# Patient Record
Sex: Female | Born: 1970
Health system: Southern US, Community
[De-identification: ages and names within clinical notes are randomized; demographics above are authoritative.]

## PROBLEM LIST (undated history)

## (undated) DIAGNOSIS — F419 Anxiety disorder, unspecified: Secondary | ICD-10-CM

## (undated) DIAGNOSIS — D649 Anemia, unspecified: Secondary | ICD-10-CM

## (undated) DIAGNOSIS — K759 Inflammatory liver disease, unspecified: Secondary | ICD-10-CM

## (undated) DIAGNOSIS — R7303 Prediabetes: Secondary | ICD-10-CM

## (undated) DIAGNOSIS — G4730 Sleep apnea, unspecified: Secondary | ICD-10-CM

## (undated) HISTORY — PX: WISDOM TOOTH EXTRACTION: SHX21

## (undated) HISTORY — DX: Sleep apnea, unspecified: G47.30

## (undated) HISTORY — DX: Anxiety disorder, unspecified: F41.9

## (undated) HISTORY — DX: Anemia, unspecified: D64.9

---

## 2005-08-19 ENCOUNTER — Emergency Department: Payer: Self-pay | Admitting: Emergency Medicine

## 2005-12-11 ENCOUNTER — Ambulatory Visit: Payer: Self-pay | Admitting: Family Medicine

## 2006-12-25 DIAGNOSIS — D509 Iron deficiency anemia, unspecified: Secondary | ICD-10-CM | POA: Insufficient documentation

## 2008-03-22 ENCOUNTER — Other Ambulatory Visit: Payer: Self-pay

## 2008-03-22 ENCOUNTER — Emergency Department: Payer: Self-pay | Admitting: Emergency Medicine

## 2009-03-13 DIAGNOSIS — J309 Allergic rhinitis, unspecified: Secondary | ICD-10-CM | POA: Insufficient documentation

## 2010-05-17 ENCOUNTER — Emergency Department: Payer: Self-pay | Admitting: Emergency Medicine

## 2010-05-27 DIAGNOSIS — K759 Inflammatory liver disease, unspecified: Secondary | ICD-10-CM | POA: Insufficient documentation

## 2014-01-20 ENCOUNTER — Ambulatory Visit: Payer: Self-pay | Admitting: Family Medicine

## 2014-01-25 DIAGNOSIS — M25569 Pain in unspecified knee: Secondary | ICD-10-CM | POA: Insufficient documentation

## 2014-01-26 ENCOUNTER — Other Ambulatory Visit: Payer: Self-pay | Admitting: Orthopedic Surgery

## 2014-01-26 DIAGNOSIS — M25561 Pain in right knee: Secondary | ICD-10-CM

## 2014-05-16 ENCOUNTER — Ambulatory Visit: Payer: Self-pay | Admitting: Family Medicine

## 2014-06-01 ENCOUNTER — Ambulatory Visit
Admission: RE | Admit: 2014-06-01 | Discharge: 2014-06-01 | Disposition: A | Discharge status: Home / Self Care | Payer: 59 | Source: Ambulatory Visit | Attending: Orthopedic Surgery | Admitting: Orthopedic Surgery

## 2014-06-01 DIAGNOSIS — M25561 Pain in right knee: Secondary | ICD-10-CM

## 2014-06-06 ENCOUNTER — Other Ambulatory Visit: Payer: Self-pay | Admitting: Orthopedic Surgery

## 2014-06-06 DIAGNOSIS — M25561 Pain in right knee: Secondary | ICD-10-CM

## 2014-06-09 ENCOUNTER — Ambulatory Visit
Admission: RE | Admit: 2014-06-09 | Discharge: 2014-06-09 | Disposition: A | Discharge status: Home / Self Care | Payer: 59 | Source: Ambulatory Visit | Attending: Orthopedic Surgery | Admitting: Orthopedic Surgery

## 2014-06-09 ENCOUNTER — Other Ambulatory Visit: Payer: 59

## 2014-06-09 DIAGNOSIS — M25561 Pain in right knee: Secondary | ICD-10-CM

## 2014-06-28 ENCOUNTER — Other Ambulatory Visit: Payer: Self-pay | Admitting: Orthopedic Surgery

## 2014-07-25 ENCOUNTER — Ambulatory Visit: Admit: 2014-07-25 | Payer: Self-pay | Admitting: Orthopedic Surgery

## 2014-07-25 SURGERY — ARTHROSCOPY, KNEE
Anesthesia: Regional | Laterality: Right

## 2014-08-10 ENCOUNTER — Encounter (HOSPITAL_COMMUNITY): Payer: Self-pay | Admitting: Pharmacy Technician

## 2014-08-11 ENCOUNTER — Inpatient Hospital Stay (HOSPITAL_COMMUNITY): Admission: RE | Admit: 2014-08-11 | Payer: Self-pay | Source: Ambulatory Visit

## 2014-08-12 NOTE — Pre-Procedure Instructions (Addendum)
Susan Sutton  08/12/2014   Your procedure is scheduled on:  08/22/14  [facility] at 1030 AM.  Call this number if you have problems the morning of surgery: [phone]   Remember:   Do not eat food or drink liquids after midnight.   Take these medicines the morning of surgery with A SIP OF WATER: none   STOP all herbel meds, nsaids (aleve,naproxen,advil,ibuprofen) 5 days prior to surgery starting 08/17/14 including vitamins, aspirin   Do not wear jewelry, make-up or nail polish.  Do not wear lotions, powders, or perfumes. You may wear deodorant.  Do not shave 48 hours prior to surgery. Men may shave face and neck.  Do not bring valuables to the hospital.  [facility] is not responsible                  for any belongings or valuables.               Contacts, dentures or bridgework may not be worn into surgery.  Leave suitcase in the car. After surgery it may be brought to your room.  For patients admitted to the hospital, discharge time is determined by your                treatment team.               Patients discharged the day of surgery will not be allowed to drive  home.  Name and phone number of your driver:   Special Instructions:  Special Instructions: [place] - Preparing for Surgery  Before surgery, you can play an important role.  Because skin is not sterile, your skin needs to be as free of germs as possible.  You can reduce the number of germs on you skin by washing with CHG (chlorahexidine gluconate) soap before surgery.  CHG is an antiseptic cleaner which kills germs and bonds with the skin to continue killing germs even after washing.  Please DO NOT use if you have an allergy to CHG or antibacterial soaps.  If your skin becomes reddened/irritated stop using the CHG and inform your nurse when you arrive at Short Stay.  Do not shave (including legs and underarms) for at least 48 hours prior to the first CHG shower.  You may shave your  face.  Please follow these instructions carefully:   1.  Shower with CHG Soap the night before surgery and the morning of Surgery.  2.  If you choose to wash your hair, wash your hair first as usual with your normal shampoo.  3.  After you shampoo, rinse your hair and body thoroughly to remove the Shampoo.  4.  Use CHG as you would any other liquid soap.  You can apply chg directly  to the skin and wash gently with scrungie or a clean washcloth.  5.  Apply the CHG Soap to your body ONLY FROM THE NECK DOWN.  Do not use on open wounds or open sores.  Avoid contact with your eyes ears, mouth and genitals (private parts).  Wash genitals (private parts)       with your normal soap.  6.  Wash thoroughly, paying special attention to the area where your surgery will be performed.  7.  Thoroughly rinse your body with warm water from the neck down.  8.  DO NOT shower/wash with your normal soap after using and rinsing off the CHG Soap.  9.  Pat yourself dry  with a clean towel.            10.  Wear clean pajamas.            11.  Place clean sheets on your bed the night of your first shower and do not sleep with pets.  Day of Surgery  Do not apply any lotions/deodorants the morning of surgery.  Please wear clean clothes to the hospital/surgery center.   Please read over the following fact sheets that you were given: Pain Booklet, Coughing and Deep Breathing and Surgical Site Infection Prevention

## 2014-08-15 ENCOUNTER — Encounter (HOSPITAL_COMMUNITY): Payer: Self-pay

## 2014-08-15 ENCOUNTER — Encounter (HOSPITAL_COMMUNITY)
Admission: RE | Admit: 2014-08-15 | Discharge: 2014-08-15 | Disposition: A | Discharge status: Home / Self Care | Payer: 59 | Source: Ambulatory Visit | Attending: Orthopedic Surgery | Admitting: Orthopedic Surgery

## 2014-08-15 DIAGNOSIS — S83281A Other tear of lateral meniscus, current injury, right knee, initial encounter: Secondary | ICD-10-CM | POA: Insufficient documentation

## 2014-08-15 DIAGNOSIS — X58XXXA Exposure to other specified factors, initial encounter: Secondary | ICD-10-CM | POA: Diagnosis not present

## 2014-08-15 DIAGNOSIS — Z0000 Encounter for general adult medical examination without abnormal findings: Secondary | ICD-10-CM | POA: Diagnosis present

## 2014-08-15 DIAGNOSIS — S83241A Other tear of medial meniscus, current injury, right knee, initial encounter: Secondary | ICD-10-CM | POA: Insufficient documentation

## 2014-08-15 DIAGNOSIS — Y9389 Activity, other specified: Secondary | ICD-10-CM | POA: Diagnosis not present

## 2014-08-15 DIAGNOSIS — Y998 Other external cause status: Secondary | ICD-10-CM | POA: Diagnosis not present

## 2014-08-15 DIAGNOSIS — B1910 Unspecified viral hepatitis B without hepatic coma: Secondary | ICD-10-CM | POA: Diagnosis not present

## 2014-08-15 DIAGNOSIS — Y9289 Other specified places as the place of occurrence of the external cause: Secondary | ICD-10-CM | POA: Insufficient documentation

## 2014-08-15 HISTORY — DX: Inflammatory liver disease, unspecified: K75.9

## 2014-08-15 LAB — COMPREHENSIVE METABOLIC PANEL
ALT: 10 U/L (ref 0–35)
ANION GAP: 13 (ref 5–15)
AST: 13 U/L (ref 0–37)
Albumin: 3.4 g/dL — ABNORMAL LOW (ref 3.5–5.2)
Alkaline Phosphatase: 108 U/L (ref 39–117)
BUN: 10 mg/dL (ref 6–23)
CO2: 21 mEq/L (ref 19–32)
Calcium: 9.1 mg/dL (ref 8.4–10.5)
Chloride: 104 mEq/L (ref 96–112)
Creatinine, Ser: 0.87 mg/dL (ref 0.50–1.10)
GFR calc non Af Amer: 81 mL/min — ABNORMAL LOW (ref >90)
GLUCOSE: 104 mg/dL — AB (ref 70–99)
POTASSIUM: 4.6 meq/L (ref 3.7–5.3)
Sodium: 138 mEq/L (ref 137–147)
Total Bilirubin: 0.3 mg/dL (ref 0.3–1.2)
Total Protein: 7.6 g/dL (ref 6.0–8.3)

## 2014-08-15 LAB — CBC
HEMATOCRIT: 34.9 % — AB (ref 36.0–46.0)
HEMOGLOBIN: 11.7 g/dL — AB (ref 12.0–15.0)
MCH: 26.7 pg (ref 26.0–34.0)
MCHC: 33.5 g/dL (ref 30.0–36.0)
MCV: 79.7 fL (ref 78.0–100.0)
Platelets: 279 10*3/uL (ref 150–400)
RBC: 4.38 MIL/uL (ref 3.87–5.11)
RDW: 13.6 % (ref 11.5–15.5)
WBC: 4.1 10*3/uL (ref 4.0–10.5)

## 2014-08-15 LAB — HCG, SERUM, QUALITATIVE: Preg, Serum: NEGATIVE

## 2014-08-21 MED ORDER — CEFAZOLIN SODIUM-DEXTROSE 2-3 GM-% IV SOLR: 2.0000 g | INTRAVENOUS | Status: AC

## 2014-08-22 ENCOUNTER — Encounter (HOSPITAL_COMMUNITY)
Admission: RE | Disposition: A | Discharge status: Home / Self Care | Payer: Self-pay | Source: Ambulatory Visit | Attending: Orthopedic Surgery

## 2014-08-22 ENCOUNTER — Ambulatory Visit (HOSPITAL_COMMUNITY): Payer: 59 | Admitting: Certified Registered Nurse Anesthetist

## 2014-08-22 ENCOUNTER — Encounter (HOSPITAL_COMMUNITY): Payer: 59 | Admitting: Certified Registered Nurse Anesthetist

## 2014-08-22 ENCOUNTER — Encounter (HOSPITAL_COMMUNITY): Payer: Self-pay | Admitting: Certified Registered Nurse Anesthetist

## 2014-08-22 ENCOUNTER — Ambulatory Visit (HOSPITAL_COMMUNITY)
Admission: RE | Admit: 2014-08-22 | Discharge: 2014-08-22 | Disposition: A | Discharge status: Home / Self Care | Payer: 59 | Source: Ambulatory Visit | Attending: Orthopedic Surgery | Admitting: Orthopedic Surgery

## 2014-08-22 DIAGNOSIS — S83241A Other tear of medial meniscus, current injury, right knee, initial encounter: Secondary | ICD-10-CM | POA: Diagnosis present

## 2014-08-22 DIAGNOSIS — X58XXXA Exposure to other specified factors, initial encounter: Secondary | ICD-10-CM | POA: Insufficient documentation

## 2014-08-22 DIAGNOSIS — Z9889 Other specified postprocedural states: Secondary | ICD-10-CM

## 2014-08-22 HISTORY — PX: KNEE ARTHROSCOPY: SHX127

## 2014-08-22 SURGERY — ARTHROSCOPY, KNEE
Anesthesia: General | Site: Knee | Laterality: Right

## 2014-08-22 MED ORDER — LACTATED RINGERS IV SOLN
INTRAVENOUS | Status: DC
  Administered 2014-08-22 (×2): via INTRAVENOUS

## 2014-08-22 MED ORDER — OXYCODONE HCL 5 MG PO TABS
5.0000 mg | ORAL_TABLET | Freq: Once | ORAL | Status: AC | PRN
  Administered 2014-08-22: 5 mg via ORAL

## 2014-08-22 MED ORDER — BUPIVACAINE-EPINEPHRINE 0.5% -1:200000 IJ SOLN
INTRAMUSCULAR | Status: DC | PRN
  Administered 2014-08-22: 20 mL

## 2014-08-22 MED ORDER — FENTANYL CITRATE 0.05 MG/ML IJ SOLN
INTRAMUSCULAR | Status: DC | PRN
  Administered 2014-08-22: 50 ug via INTRAVENOUS
  Administered 2014-08-22 (×3): 25 ug via INTRAVENOUS
  Administered 2014-08-22: 50 ug via INTRAVENOUS
  Administered 2014-08-22: 100 ug via INTRAVENOUS
  Administered 2014-08-22 (×3): 25 ug via INTRAVENOUS

## 2014-08-22 MED ORDER — ONDANSETRON HCL 4 MG/2ML IJ SOLN
INTRAMUSCULAR | Status: AC
  Filled 2014-08-22: qty 2

## 2014-08-22 MED ORDER — OXYCODONE HCL 5 MG PO TABS
ORAL_TABLET | ORAL | Status: AC
  Filled 2014-08-22: qty 1

## 2014-08-22 MED ORDER — CHLORHEXIDINE GLUCONATE 4 % EX LIQD
60.0000 mL | Freq: Once | CUTANEOUS | Status: DC
  Filled 2014-08-22: qty 60

## 2014-08-22 MED ORDER — DEXAMETHASONE SODIUM PHOSPHATE 4 MG/ML IJ SOLN
INTRAMUSCULAR | Status: DC | PRN
  Administered 2014-08-22: 4 mg via INTRAVENOUS

## 2014-08-22 MED ORDER — MIDAZOLAM HCL 2 MG/2ML IJ SOLN
INTRAMUSCULAR | Status: AC
  Filled 2014-08-22: qty 2

## 2014-08-22 MED ORDER — DIPHENHYDRAMINE HCL 50 MG/ML IJ SOLN
INTRAMUSCULAR | Status: AC
  Filled 2014-08-22: qty 1

## 2014-08-22 MED ORDER — TRAMADOL HCL 50 MG PO TABS: 50.0000 mg | ORAL_TABLET | Freq: Four times a day (QID) | ORAL | Status: DC | PRN

## 2014-08-22 MED ORDER — CYCLOBENZAPRINE HCL 5 MG PO TABS: 5.0000 mg | ORAL_TABLET | Freq: Three times a day (TID) | ORAL | Status: DC | PRN

## 2014-08-22 MED ORDER — CEFAZOLIN SODIUM-DEXTROSE 2-3 GM-% IV SOLR: 2.0000 g | INTRAVENOUS | Status: DC

## 2014-08-22 MED ORDER — DIPHENHYDRAMINE HCL 50 MG/ML IJ SOLN
INTRAMUSCULAR | Status: DC | PRN
  Administered 2014-08-22: 25 mg via INTRAVENOUS

## 2014-08-22 MED ORDER — DEXAMETHASONE SODIUM PHOSPHATE 4 MG/ML IJ SOLN
INTRAMUSCULAR | Status: AC
  Filled 2014-08-22: qty 1

## 2014-08-22 MED ORDER — PROPOFOL 10 MG/ML IV BOLUS
INTRAVENOUS | Status: AC
  Filled 2014-08-22: qty 20

## 2014-08-22 MED ORDER — HYDROMORPHONE HCL 1 MG/ML IJ SOLN: 0.2500 mg | INTRAMUSCULAR | Status: DC | PRN

## 2014-08-22 MED ORDER — MIDAZOLAM HCL 5 MG/5ML IJ SOLN
INTRAMUSCULAR | Status: DC | PRN
  Administered 2014-08-22: 2 mg via INTRAVENOUS

## 2014-08-22 MED ORDER — ONDANSETRON HCL 4 MG/2ML IJ SOLN
INTRAMUSCULAR | Status: DC | PRN
  Administered 2014-08-22: 4 mg via INTRAVENOUS

## 2014-08-22 MED ORDER — PROPOFOL 10 MG/ML IV BOLUS
INTRAVENOUS | Status: DC | PRN
  Administered 2014-08-22: 200 mg via INTRAVENOUS

## 2014-08-22 MED ORDER — BUPIVACAINE-EPINEPHRINE (PF) 0.5% -1:200000 IJ SOLN
INTRAMUSCULAR | Status: AC
  Filled 2014-08-22: qty 30

## 2014-08-22 MED ORDER — FENTANYL CITRATE 0.05 MG/ML IJ SOLN
INTRAMUSCULAR | Status: AC
  Filled 2014-08-22: qty 5

## 2014-08-22 MED ORDER — OXYCODONE HCL 5 MG/5ML PO SOLN: 5.0000 mg | Freq: Once | ORAL | Status: AC | PRN

## 2014-08-22 MED ORDER — CEFAZOLIN SODIUM-DEXTROSE 2-3 GM-% IV SOLR
INTRAVENOUS | Status: DC
Start: 2014-08-22 — End: 2014-08-22
  Filled 2014-08-22: qty 50

## 2014-08-22 MED ORDER — CHLORHEXIDINE GLUCONATE 4 % EX LIQD
60.0000 mL | Freq: Once | CUTANEOUS | Status: DC
Start: 2014-08-22 — End: 2014-08-22
  Filled 2014-08-22: qty 60

## 2014-08-22 MED ORDER — DEXTROSE 5 % IV SOLN
3.0000 g | Freq: Once | INTRAVENOUS | Status: AC
  Administered 2014-08-22: 3 g via INTRAVENOUS
  Filled 2014-08-22: qty 3000

## 2014-08-22 MED ORDER — SODIUM CHLORIDE 0.9 % IR SOLN
Status: DC | PRN
  Administered 2014-08-22: 6000 mL

## 2014-08-22 MED ORDER — PROMETHAZINE HCL 25 MG/ML IJ SOLN: 6.2500 mg | INTRAMUSCULAR | Status: DC | PRN

## 2014-08-22 MED ORDER — IBUPROFEN 200 MG PO TABS: 800.0000 mg | ORAL_TABLET | Freq: Three times a day (TID) | ORAL | Status: DC

## 2014-08-22 SURGICAL SUPPLY — 31 items
BANDAGE ELASTIC 6 VELCRO ST LF (GAUZE/BANDAGES/DRESSINGS) ×2 IMPLANT
BLADE GREAT WHITE 4.2 (BLADE) ×2 IMPLANT
DRAPE ARTHROSCOPY W/POUCH 114 (DRAPES) ×2 IMPLANT
DRAPE U-SHAPE 47X51 STRL (DRAPES) ×2 IMPLANT
DRSG PAD ABDOMINAL 8X10 ST (GAUZE/BANDAGES/DRESSINGS) ×2 IMPLANT
ELECT REM PT RETURN 9FT ADLT (ELECTROSURGICAL)
ELECTRODE REM PT RTRN 9FT ADLT (ELECTROSURGICAL) IMPLANT
GAUZE SPONGE 4X4 12PLY STRL (GAUZE/BANDAGES/DRESSINGS) ×2 IMPLANT
GAUZE XEROFORM 1X8 LF (GAUZE/BANDAGES/DRESSINGS) ×2 IMPLANT
GLOVE BIOGEL PI IND STRL 8.5 (GLOVE) ×2 IMPLANT
GLOVE BIOGEL PI INDICATOR 8.5 (GLOVE) ×2
GLOVE SURG ORTHO 8.0 STRL STRW (GLOVE) ×4 IMPLANT
GOWN STRL REUS W/ TWL LRG LVL3 (GOWN DISPOSABLE) ×1 IMPLANT
GOWN STRL REUS W/TWL LRG LVL3 (GOWN DISPOSABLE) ×1
GOWN STRL REUS W/TWL XL LVL3 (GOWN DISPOSABLE) ×4 IMPLANT
KIT ROOM TURNOVER OR (KITS) ×2 IMPLANT
MANIFOLD NEPTUNE II (INSTRUMENTS) ×2 IMPLANT
PACK ARTHROSCOPY DSU (CUSTOM PROCEDURE TRAY) ×2 IMPLANT
PAD ARMBOARD 7.5X6 YLW CONV (MISCELLANEOUS) ×4 IMPLANT
PAD CAST 4YDX4 CTTN HI CHSV (CAST SUPPLIES) ×1 IMPLANT
PADDING CAST COTTON 4X4 STRL (CAST SUPPLIES) ×1
PENCIL BUTTON HOLSTER BLD 10FT (ELECTRODE) IMPLANT
SET ARTHROSCOPY TUBING (MISCELLANEOUS) ×1
SET ARTHROSCOPY TUBING LN (MISCELLANEOUS) ×1 IMPLANT
SPONGE LAP 4X18 X RAY DECT (DISPOSABLE) ×2 IMPLANT
SUT ETHILON 4 0 PS 2 18 (SUTURE) ×2 IMPLANT
SYR 30ML LL (SYRINGE) ×4 IMPLANT
TOWEL OR 17X24 6PK STRL BLUE (TOWEL DISPOSABLE) ×2 IMPLANT
TOWEL OR 17X26 10 PK STRL BLUE (TOWEL DISPOSABLE) ×2 IMPLANT
WAND HAND CNTRL MULTIVAC 90 (MISCELLANEOUS) ×2 IMPLANT
WATER STERILE IRR 1000ML POUR (IV SOLUTION) ×2 IMPLANT

## 2014-08-22 NOTE — Anesthesia Postprocedure Evaluation (Signed)
Anesthesia Post Note  Patient: Susan Sutton  Procedure(s) Performed: Procedure(s) (LRB): ARTHROSCOPY KNEE (Right)  Anesthesia type: general  Patient location: PACU  Post pain: Pain level controlled  Post assessment: Patient's Cardiovascular Status Stable  Last Vitals:  Filed Vitals:   08/22/14 1500  BP: 158/78  Pulse: 67  Temp:   Resp: 15    Post vital signs: Reviewed and stable  Level of consciousness: sedated  Complications: No apparent anesthesia complications

## 2014-08-22 NOTE — H&P (Signed)
  Susan Sutton MRN:  [id number] DOB/SEX:  04/30/1971/female  CHIEF COMPLAINT:  Painful right Knee  HISTORY: Patient is a 43 y.o. female presented with a history of pain in the right knee. Onset of symptoms was abrupt starting several weeks ago with rapidly worsening course since that time. Prior procedures on the knee include none. Patient has been treated conservatively with over-the-counter NSAIDs and activity modification. Patient currently rates pain in the knee at 8 out of 10 with activity. There is no pain at night.  PAST MEDICAL HISTORY: There are no active problems to display for this patient.  Past Medical History  Diagnosis Date  . Hepatitis     "b"   No past surgical history on file.   MEDICATIONS:   No prescriptions prior to admission    ALLERGIES:  No Known Allergies  REVIEW OF SYSTEMS:  A comprehensive review of systems was negative.   FAMILY HISTORY:  No family history on file.  SOCIAL HISTORY:   History  Substance Use Topics  . Smoking status: Never Smoker   . Smokeless tobacco: Not on file  . Alcohol Use: Yes     Comment: occ wine     EXAMINATION:  Vital signs in last 24 hours:    General appearance: alert, cooperative and no distress Lungs: clear to auscultation bilaterally Heart: regular rate and rhythm, S1, S2 normal, no murmur, click, rub or gallop Abdomen: soft, non-tender; bowel sounds normal; no masses,  no organomegaly Extremities: extremities normal, atraumatic, no cyanosis or edema and Homans sign is negative, no sign of DVT Pulses: 2+ and symmetric Skin: Skin color, texture, turgor normal. No rashes or lesions Neurologic: Alert and oriented X 3, normal strength and tone. Normal symmetric reflexes. Normal coordination and gait  Musculoskeletal:  ROM 0-115, Ligaments intact,  Imaging Review Plain radiographs demonstrate mild degenerative joint disease of the right knee.  MRI: meniscal tear  Assessment/Plan:  right knee meniscal  tear  Right knee arthroscopy  Shaqueena Mauceri 08/22/2014, 6:34 AM

## 2014-08-22 NOTE — Discharge Instructions (Signed)
Diet: As you were doing prior to hospitalization   Activity:  Increase activity slowly as tolerated                  No lifting or driving for 6 weeks  Shower:  May shower without a dressing once there is no drainage from your wound.                 Do NOT wash over the wound.                 Dressing:  You may change your dressing on Wednesday                    Then change the dressing daily with sterile 4"x4"s gauze dressing                     And TED hose for knees.  Weight Bearing:  Weight bearing as tolerated as taught in physical therapy.  Use a                                walker or Crutches as instructed.  To prevent constipation: you may use a stool softener such as -               Colace ( over the counter) 100 mg by mouth twice a day                Drink plenty of fluids ( prune juice may be helpful) and high fiber foods                Miralax ( over the counter) for constipation as needed.    Precautions:  If you experience chest pain or shortness of breath - call 911 immediately               For transfer to the hospital emergency department!!               If you develop a fever greater that 101 F, purulent drainage from wound,                             increased redness or drainage from wound, or calf pain -- Call the office.  Follow- Up Appointment:  Please call for an appointment to be seen on 08/25/14                                              [facility] office:  [phone]            [address] [place], [place] [address]

## 2014-08-22 NOTE — Transfer of Care (Signed)
Immediate Anesthesia Transfer of Care Note  Patient: Susan Sutton  Procedure(s) Performed: Procedure(s) with comments: ARTHROSCOPY KNEE (Right) - Partial medial menisectomy  Patient Location: PACU  Anesthesia Type:General  Level of Consciousness: awake, alert  and oriented  Airway & Oxygen Therapy: Patient Spontanous Breathing and Patient connected to nasal cannula oxygen  Post-op Assessment: Report given to PACU RN and Post -op Vital signs reviewed and stable  Post vital signs: Reviewed and stable  Complications: No apparent anesthesia complications

## 2014-08-22 NOTE — Anesthesia Procedure Notes (Signed)
Procedure Name: LMA Insertion Date/Time: 08/22/2014 1:28 PM Performed by: Margaree MackintoshYACOUB, Tyreece Gelles B Pre-anesthesia Checklist: Patient identified, Emergency Drugs available, Suction available, Patient being monitored and Timeout performed Patient Re-evaluated:Patient Re-evaluated prior to inductionOxygen Delivery Method: Circle system utilized Preoxygenation: Pre-oxygenation with 100% oxygen Intubation Type: IV induction LMA: LMA inserted LMA Size: 4.0 Number of attempts: 1 Placement Confirmation: breath sounds checked- equal and bilateral and positive ETCO2 Tube secured with: Tape Dental Injury: Teeth and Oropharynx as per pre-operative assessment

## 2014-08-22 NOTE — Anesthesia Preprocedure Evaluation (Addendum)
Anesthesia Evaluation  Patient identified by MRN, date of birth, ID band Patient awake    Reviewed: Allergy & Precautions, H&P , NPO status , Patient's Chart, lab work & pertinent test results  Airway Mallampati: II TM Distance: >3 FB Neck ROM: full    Dental  (+) Teeth Intact, Dental Advidsory Given   Pulmonary neg pulmonary ROS,  breath sounds clear to auscultation        Cardiovascular negative cardio ROS  Rhythm:regular Rate:Normal     Neuro/Psych negative neurological ROS  negative psych ROS   GI/Hepatic negative GI ROS, (+) Hepatitis -, B  Endo/Other  negative endocrine ROS  Renal/GU negative Renal ROS     Musculoskeletal   Abdominal   Peds  Hematology   Anesthesia Other Findings   Reproductive/Obstetrics negative OB ROS                          Anesthesia Physical Anesthesia Plan  ASA: II  Anesthesia Plan: General LMA   Post-op Pain Management:    Induction:   Airway Management Planned:   Additional Equipment:   Intra-op Plan:   Post-operative Plan:   Informed Consent: I have reviewed the patients History and Physical, chart, labs and discussed the procedure including the risks, benefits and alternatives for the proposed anesthesia with the patient or authorized representative who has indicated his/her understanding and acceptance.   Dental Advisory Given  Plan Discussed with: Anesthesiologist, CRNA and Surgeon  Anesthesia Plan Comments:         Anesthesia Quick Evaluation

## 2014-08-23 ENCOUNTER — Encounter (HOSPITAL_COMMUNITY): Payer: Self-pay | Admitting: Orthopedic Surgery

## 2014-08-23 NOTE — Op Note (Signed)
NAMSallee Sutton:  Hines, Sriya                 ACCOUNT NO.:  [id number]  MEDICAL RECORD NO.:  [id number]  LOCATION:  [facility]                         FACILITY:  [facility]  PHYSICIAN:  Mila HomerStephen D. Sherlean FootLucey, M.D. DATE OF BIRTH:  1971/03/29  DATE OF PROCEDURE:  08/22/2014 DATE OF DISCHARGE:  08/22/2014                              OPERATIVE REPORT   SURGEON:  Mila HomerStephen D. Sherlean FootLucey, M.D.  ASSISTANT:  None.  ANESTHESIA:  General.  PREOPERATIVE DIAGNOSIS:  Right knee medial meniscus tear.  POSTOPERATIVE DIAGNOSIS:  Right knee medial meniscus tear.  PROCEDURE:  Right knee arthroscopy with partial medial meniscectomy.  INDICATION FOR PROCEDURE:  The patient is a 43 year old, white female with mechanical symptoms.  MRI evidence of the posterior horn medial meniscus tear.  Informed consent was obtained.  DESCRIPTION OF PROCEDURE:  The patient was laid supine, administered general anesthesia.  The right leg was prepped and draped in usual fashion.  Inferolateral and inferomedial portals were created with a #11 blade, blunt trocar, and cannula.  Diagnostic arthroscopy revealed no chondromalacia in the patellofemoral joint.  Went into the medial compartment, she had grade 2 chondromalacia which was debrided lightly with a [employer].  The posterior horn had complex tearing.  I used straight and up-biting basket forceps to perform partial medial meniscectomy.  I then went into the notch, the ACL and PCL were normal. Went into the figure 4 position, the lateral part was normal as well.  I then lavaged and closed with 4-0 nylon sutures.  Dressed with Xeroform dressing, sponges, sterile Webril, and Ace wrap.  COMPLICATIONS:  None.  DRAINS:  None.          ______________________________ Mila HomerStephen D. Sherlean FootLucey, M.D.     SDL/MEDQ  D:  08/23/2014  T:  08/23/2014  Job:  [id number]

## 2014-08-23 NOTE — Op Note (Signed)
Dictation Number:  [id number]

## 2015-01-23 LAB — LIPID PANEL
CHOLESTEROL: 146 mg/dL (ref 0–200)
HDL: 50 mg/dL (ref 35–70)
LDL Cholesterol: 84 mg/dL
TRIGLYCERIDES: 61 mg/dL (ref 40–160)

## 2015-01-23 LAB — HEPATIC FUNCTION PANEL
ALT: 19 U/L (ref 7–35)
AST: 15 U/L (ref 13–35)
Alkaline Phosphatase: 109 U/L (ref 25–125)
Bilirubin, Total: 0.5 mg/dL

## 2015-01-23 LAB — CBC AND DIFFERENTIAL
HEMATOCRIT: 36 % (ref 36–46)
Hemoglobin: 11.8 g/dL — AB (ref 12.0–16.0)
Neutrophils Absolute: 1 /uL
Platelets: 247 10*3/uL (ref 150–399)
WBC: 3.5 10^3/mL

## 2015-01-23 LAB — BASIC METABOLIC PANEL
BUN: 8 mg/dL (ref 4–21)
Creatinine: 0.8 mg/dL (ref 0.5–1.1)
Glucose: 96 mg/dL
Potassium: 4.3 mmol/L (ref 3.4–5.3)
SODIUM: 139 mmol/L (ref 137–147)

## 2015-01-23 LAB — TSH: TSH: 1.6 u[IU]/mL (ref 0.41–5.90)

## 2015-12-28 DIAGNOSIS — E669 Obesity, unspecified: Secondary | ICD-10-CM | POA: Insufficient documentation

## 2016-01-22 ENCOUNTER — Encounter: Payer: Self-pay | Admitting: Family Medicine

## 2016-02-05 ENCOUNTER — Encounter: Payer: Self-pay | Admitting: Family Medicine

## 2016-02-05 ENCOUNTER — Ambulatory Visit (INDEPENDENT_AMBULATORY_CARE_PROVIDER_SITE_OTHER): Payer: Commercial Managed Care - HMO | Admitting: Family Medicine

## 2016-02-05 VITALS — BP 132/80 | HR 72 | Temp 98.1°F | Resp 16 | Ht 67.0 in | Wt 309.0 lb

## 2016-02-05 DIAGNOSIS — Z1239 Encounter for other screening for malignant neoplasm of breast: Secondary | ICD-10-CM | POA: Diagnosis not present

## 2016-02-05 DIAGNOSIS — Z124 Encounter for screening for malignant neoplasm of cervix: Secondary | ICD-10-CM | POA: Diagnosis not present

## 2016-02-05 DIAGNOSIS — Z0000 Encounter for general adult medical examination without abnormal findings: Secondary | ICD-10-CM | POA: Diagnosis not present

## 2016-02-05 DIAGNOSIS — R002 Palpitations: Secondary | ICD-10-CM

## 2016-02-05 DIAGNOSIS — D509 Iron deficiency anemia, unspecified: Secondary | ICD-10-CM

## 2016-02-05 DIAGNOSIS — E669 Obesity, unspecified: Secondary | ICD-10-CM | POA: Diagnosis not present

## 2016-02-05 NOTE — Progress Notes (Signed)
Patient ID: Susan Sutton Sutton, female   DOB: 07/09/1971, 45 y.o.   MRN: [id number]       Patient: Susan Sutton Irigoyen, Female    DOB: 07/19/1971, 45 y.o.   MRN: [id number] Visit Date: 02/05/2016  Today's Provider: Lorie PhenixNancy Infant Doane, MD   Chief Complaint  Patient presents with  . Annual Exam   Subjective:    Annual physical exam Susan Sutton Mcwhirt is a 45 y.o. female who presents today for health maintenance and complete physical. She feels well. She reports exercising none. She reports she is sleeping well.  01/20/15 CPE 01/17/14 Pap-neg; HPV-neg 05/16/14 Mammogram-BI-RADS 1 -----------------------------------------------------------------   Review of Systems  Constitutional: Negative.   HENT: Negative.   Eyes: Negative.   Respiratory: Positive for shortness of breath.   Cardiovascular: Positive for palpitations.  Gastrointestinal: Positive for constipation.  Endocrine: Negative.   Genitourinary: Positive for urgency.  Musculoskeletal: Negative.   Skin: Negative.   Allergic/Immunologic: Negative.   Neurological: Negative.   Hematological: Negative.   Psychiatric/Behavioral: Negative.     Social History      She  reports that she has never smoked. She has never used smokeless tobacco. She reports that she does not drink alcohol or use illicit drugs.       Social History   Social History  . Marital Status: Single    Spouse Name: N/A  . Number of Children: N/A  . Years of Education: N/A   Social History Main Topics  . Smoking status: Never Smoker   . Smokeless tobacco: Never Used  . Alcohol Use: No     Comment: occ wine  . Drug Use: No  . Sexual Activity: Not Asked   Other Topics Concern  . None   Social History Narrative    Past Medical History  Diagnosis Date  . Hepatitis     "b"     Patient Active Problem List   Diagnosis Date Noted  . Adiposity 12/28/2015  . Gonalgia 01/25/2014  . Hepatitis 05/27/2010  . Allergic rhinitis 03/13/2009  . Anemia, iron deficiency  12/25/2006    Past Surgical History  Procedure Laterality Date  . Knee arthroscopy Right 08/22/2014    Procedure: ARTHROSCOPY KNEE;  Surgeon: Dannielle HuhSteve Lucey, MD;  [facility];  Service: Orthopedics;  Laterality: Right;  Partial medial menisectomy    Family History        Family Status  Relation Status Death Age  . Mother Deceased   . Father Alive   . Sister Alive   . Brother Alive   . Paternal Grandmother Deceased         Her family history includes Breast cancer in her paternal aunt and paternal grandmother; Colon polyps in her sister; Congestive Heart Failure in her father and mother; Diabetes in her father, mother, and sister; Heart attack in her father and mother; Hyperlipidemia in her sister; Hypertension in her brother, father, mother, and sister; Lupus in her sister.    No Known Allergies  Previous Medications   CETIRIZINE (ZYRTEC ALLERGY) 10 MG TABLET    Take 10 mg by mouth as needed for allergies.   GUAIFENESIN (MUCINEX) 600 MG 12 HR TABLET    Take 600 mg by mouth as needed.   IBUPROFEN (ADVIL,MOTRIN) 200 MG TABLET    Take 4 tablets (800 mg total) by mouth 3 (three) times daily.    Patient Care Team: Lorie PhenixNancy Aislin Onofre, MD as PCP - General (Family Medicine)     Objective:   Vitals: BP 132/80 mmHg  Pulse  72  Temp(Src) 98.1 F (36.7 C) (Oral)  Resp 16  Ht  (1.702 m)  Wt 309 lb (140.161 kg)  BMI 48.38 kg/m2  SpO2 97%  LMP 01/09/2016 (Approximate)   Physical Exam  Constitutional: She is oriented to person, place, and time. She appears well-developed and well-nourished.  HENT:  Head: Normocephalic and atraumatic.  Right Ear: Tympanic membrane, external ear and ear canal normal.  Left Ear: Tympanic membrane, external ear and ear canal normal.  Nose: Nose normal.  Mouth/Throat: Uvula is midline, oropharynx is clear and moist and mucous membranes are normal.  Eyes: Conjunctivae, EOM and lids are normal. Pupils are equal, round, and reactive to light.  Neck:  Trachea normal and normal range of motion. Neck supple. Carotid bruit is not present. No thyroid mass and no thyromegaly present.  Cardiovascular: Normal rate, regular rhythm, normal heart sounds and intact distal pulses.   Pulmonary/Chest: Effort normal and breath sounds normal.  Abdominal: Soft. Normal appearance and bowel sounds are normal. There is no hepatosplenomegaly. There is no tenderness.  Genitourinary: No breast swelling, tenderness or discharge.  Musculoskeletal: Normal range of motion.  Lymphadenopathy:    She has no cervical adenopathy.    She has no axillary adenopathy.  Neurological: She is alert and oriented to person, place, and time. She has normal strength. No cranial nerve deficit.  Skin: Skin is warm, dry and intact.  Psychiatric: She has a normal mood and affect. Her speech is normal and behavior is normal. Judgment and thought content normal. Cognition and memory are normal.     Depression Screen PHQ 2/9 Scores 02/05/2016  PHQ - 2 Score 0      Assessment & Plan:     Routine Health Maintenance and Physical Exam  Exercise Activities and Dietary recommendations Goals    None       There is no immunization history on file for this patient.      1. Annual physical exam Stable. Patient advised to continue eating healthy and exercise daily. Needs to really focus on weight loss.    2. Palpitations Stable. Recurrent. Patient referred to cardiology for evaluation and follow-up. - Ambulatory referral to Cardiology  3. Breast cancer screening - [facility] DIGITAL SCREENING BILATERAL; Future  4. Cervical cancer screening F/U pending lab report. - Pap IG and HPV (high risk) DNA detection  5. Adiposity - Comprehensive metabolic panel - TSH  6. Anemia, iron deficiency - CBC with Differential/Platelet - Comprehensive metabolic panel - TSH   Patient seen and examined by Dr. Leo Grosser, and note scribed by Liz Beach. Dimas, CMA.  I have reviewed the  document for accuracy and completeness and I agree with above. Leo Grosser, MD   Lorie Phenix, MD   --------------------------------------------------------------------

## 2016-02-07 ENCOUNTER — Encounter: Payer: Self-pay | Admitting: Family Medicine

## 2016-02-07 ENCOUNTER — Telehealth: Payer: Self-pay

## 2016-02-07 LAB — PAP IG AND HPV HIGH-RISK
HPV, high-risk: NEGATIVE
PAP SMEAR COMMENT: 0

## 2016-02-07 NOTE — Telephone Encounter (Signed)
LMTCB

## 2016-02-07 NOTE — Telephone Encounter (Signed)
Patient advised as directed below. 

## 2016-02-07 NOTE — Telephone Encounter (Signed)
-----   Message from Lorie PhenixNancy Maloney, MD sent at 02/07/2016  1:52 PM EDT ----- PAP normal. Also, please notify patient. Also tell patient we watched her on You-tube and she is amazing.   Hope we have a link to her singing this weekend and tell her to have a great time.   Thanks.

## 2016-02-13 LAB — COMPREHENSIVE METABOLIC PANEL
ALK PHOS: 100 IU/L (ref 39–117)
ALT: 9 IU/L (ref 0–32)
AST: 15 IU/L (ref 0–40)
Albumin/Globulin Ratio: 1.3 (ref 1.2–2.2)
Albumin: 4.1 g/dL (ref 3.5–5.5)
BILIRUBIN TOTAL: 0.5 mg/dL (ref 0.0–1.2)
BUN/Creatinine Ratio: 13 (ref 9–23)
BUN: 12 mg/dL (ref 6–24)
CHLORIDE: 105 mmol/L (ref 96–106)
CO2: 23 mmol/L (ref 18–29)
CREATININE: 0.93 mg/dL (ref 0.57–1.00)
Calcium: 9.4 mg/dL (ref 8.7–10.2)
GFR calc Af Amer: 87 mL/min/{1.73_m2} (ref >59)
GFR calc non Af Amer: 76 mL/min/{1.73_m2} (ref >59)
GLOBULIN, TOTAL: 3.2 g/dL (ref 1.5–4.5)
Glucose: 100 mg/dL — ABNORMAL HIGH (ref 65–99)
Potassium: 4.6 mmol/L (ref 3.5–5.2)
SODIUM: 143 mmol/L (ref 134–144)
Total Protein: 7.3 g/dL (ref 6.0–8.5)

## 2016-02-13 LAB — CBC WITH DIFFERENTIAL/PLATELET
Basophils Absolute: 0 10*3/uL (ref 0.0–0.2)
Basos: 1 %
EOS (ABSOLUTE): 0.1 10*3/uL (ref 0.0–0.4)
Eos: 3 %
HEMATOCRIT: 34.5 % (ref 34.0–46.6)
Hemoglobin: 11.3 g/dL (ref 11.1–15.9)
Immature Grans (Abs): 0 10*3/uL (ref 0.0–0.1)
Immature Granulocytes: 0 %
Lymphocytes Absolute: 1.8 10*3/uL (ref 0.7–3.1)
Lymphs: 45 %
MCH: 26.3 pg — AB (ref 26.6–33.0)
MCHC: 32.8 g/dL (ref 31.5–35.7)
MCV: 80 fL (ref 79–97)
MONOS ABS: 0.3 10*3/uL (ref 0.1–0.9)
Monocytes: 8 %
Neutrophils Absolute: 1.7 10*3/uL (ref 1.4–7.0)
Neutrophils: 43 %
Platelets: 300 10*3/uL (ref 150–379)
RBC: 4.3 x10E6/uL (ref 3.77–5.28)
RDW: 14.3 % (ref 12.3–15.4)
WBC: 4 10*3/uL (ref 3.4–10.8)

## 2016-02-13 LAB — TSH: TSH: 1.49 u[IU]/mL (ref 0.450–4.500)

## 2016-02-13 LAB — HIV ANTIBODY (ROUTINE TESTING W REFLEX): HIV SCREEN 4TH GENERATION: NONREACTIVE

## 2016-02-14 ENCOUNTER — Encounter: Payer: Self-pay | Admitting: *Deleted

## 2016-04-25 ENCOUNTER — Ambulatory Visit: Payer: Self-pay | Admitting: Family Medicine

## 2016-04-30 ENCOUNTER — Telehealth: Payer: Self-pay | Admitting: Family Medicine

## 2016-04-30 ENCOUNTER — Ambulatory Visit: Payer: Self-pay | Admitting: Family Medicine

## 2016-04-30 NOTE — Telephone Encounter (Signed)
Pt is requesting a referral for Cardiologist and [place].

## 2016-04-30 NOTE — Telephone Encounter (Signed)
Do not need to refer for PCP. Does she want new MD to order cardiology referral. Thanks.

## 2016-04-30 NOTE — Telephone Encounter (Signed)
Pt will find new PCP and have them refer to cardiology. Allene DillonEmily Drozdowski, CMA

## 2017-02-07 DIAGNOSIS — Z862 Personal history of diseases of the blood and blood-forming organs and certain disorders involving the immune mechanism: Secondary | ICD-10-CM | POA: Diagnosis not present

## 2017-02-07 DIAGNOSIS — R0609 Other forms of dyspnea: Secondary | ICD-10-CM | POA: Diagnosis not present

## 2017-02-07 DIAGNOSIS — R05 Cough: Secondary | ICD-10-CM | POA: Diagnosis not present

## 2017-02-07 DIAGNOSIS — R5383 Other fatigue: Secondary | ICD-10-CM | POA: Diagnosis not present

## 2017-03-11 DIAGNOSIS — M531 Cervicobrachial syndrome: Secondary | ICD-10-CM | POA: Diagnosis not present

## 2017-03-11 DIAGNOSIS — M47812 Spondylosis without myelopathy or radiculopathy, cervical region: Secondary | ICD-10-CM | POA: Diagnosis not present

## 2017-03-11 DIAGNOSIS — M9901 Segmental and somatic dysfunction of cervical region: Secondary | ICD-10-CM | POA: Diagnosis not present

## 2017-03-11 DIAGNOSIS — M5431 Sciatica, right side: Secondary | ICD-10-CM | POA: Diagnosis not present

## 2017-03-12 DIAGNOSIS — M531 Cervicobrachial syndrome: Secondary | ICD-10-CM | POA: Diagnosis not present

## 2017-03-12 DIAGNOSIS — M9901 Segmental and somatic dysfunction of cervical region: Secondary | ICD-10-CM | POA: Diagnosis not present

## 2017-03-12 DIAGNOSIS — M5431 Sciatica, right side: Secondary | ICD-10-CM | POA: Diagnosis not present

## 2017-03-12 DIAGNOSIS — M47812 Spondylosis without myelopathy or radiculopathy, cervical region: Secondary | ICD-10-CM | POA: Diagnosis not present

## 2017-03-18 DIAGNOSIS — M5431 Sciatica, right side: Secondary | ICD-10-CM | POA: Diagnosis not present

## 2017-03-18 DIAGNOSIS — M531 Cervicobrachial syndrome: Secondary | ICD-10-CM | POA: Diagnosis not present

## 2017-03-18 DIAGNOSIS — M9901 Segmental and somatic dysfunction of cervical region: Secondary | ICD-10-CM | POA: Diagnosis not present

## 2017-03-18 DIAGNOSIS — M47812 Spondylosis without myelopathy or radiculopathy, cervical region: Secondary | ICD-10-CM | POA: Diagnosis not present

## 2017-03-20 DIAGNOSIS — M47812 Spondylosis without myelopathy or radiculopathy, cervical region: Secondary | ICD-10-CM | POA: Diagnosis not present

## 2017-03-20 DIAGNOSIS — M9901 Segmental and somatic dysfunction of cervical region: Secondary | ICD-10-CM | POA: Diagnosis not present

## 2017-03-20 DIAGNOSIS — M5431 Sciatica, right side: Secondary | ICD-10-CM | POA: Diagnosis not present

## 2017-03-20 DIAGNOSIS — M531 Cervicobrachial syndrome: Secondary | ICD-10-CM | POA: Diagnosis not present

## 2017-03-25 DIAGNOSIS — M9901 Segmental and somatic dysfunction of cervical region: Secondary | ICD-10-CM | POA: Diagnosis not present

## 2017-03-25 DIAGNOSIS — M531 Cervicobrachial syndrome: Secondary | ICD-10-CM | POA: Diagnosis not present

## 2017-03-25 DIAGNOSIS — M5431 Sciatica, right side: Secondary | ICD-10-CM | POA: Diagnosis not present

## 2017-03-25 DIAGNOSIS — M47812 Spondylosis without myelopathy or radiculopathy, cervical region: Secondary | ICD-10-CM | POA: Diagnosis not present

## 2017-04-03 DIAGNOSIS — M5431 Sciatica, right side: Secondary | ICD-10-CM | POA: Diagnosis not present

## 2017-04-03 DIAGNOSIS — M47812 Spondylosis without myelopathy or radiculopathy, cervical region: Secondary | ICD-10-CM | POA: Diagnosis not present

## 2017-04-03 DIAGNOSIS — M531 Cervicobrachial syndrome: Secondary | ICD-10-CM | POA: Diagnosis not present

## 2017-04-03 DIAGNOSIS — M9901 Segmental and somatic dysfunction of cervical region: Secondary | ICD-10-CM | POA: Diagnosis not present

## 2017-04-04 DIAGNOSIS — M531 Cervicobrachial syndrome: Secondary | ICD-10-CM | POA: Diagnosis not present

## 2017-04-04 DIAGNOSIS — M5431 Sciatica, right side: Secondary | ICD-10-CM | POA: Diagnosis not present

## 2017-04-04 DIAGNOSIS — M9901 Segmental and somatic dysfunction of cervical region: Secondary | ICD-10-CM | POA: Diagnosis not present

## 2017-04-04 DIAGNOSIS — M47812 Spondylosis without myelopathy or radiculopathy, cervical region: Secondary | ICD-10-CM | POA: Diagnosis not present

## 2017-04-08 DIAGNOSIS — M5431 Sciatica, right side: Secondary | ICD-10-CM | POA: Diagnosis not present

## 2017-04-08 DIAGNOSIS — M47812 Spondylosis without myelopathy or radiculopathy, cervical region: Secondary | ICD-10-CM | POA: Diagnosis not present

## 2017-04-08 DIAGNOSIS — M9901 Segmental and somatic dysfunction of cervical region: Secondary | ICD-10-CM | POA: Diagnosis not present

## 2017-04-08 DIAGNOSIS — M531 Cervicobrachial syndrome: Secondary | ICD-10-CM | POA: Diagnosis not present

## 2017-08-28 DIAGNOSIS — Z1231 Encounter for screening mammogram for malignant neoplasm of breast: Secondary | ICD-10-CM | POA: Diagnosis not present

## 2017-09-10 ENCOUNTER — Ambulatory Visit: Payer: Commercial Managed Care - HMO | Admitting: Family Medicine

## 2017-09-18 DIAGNOSIS — I10 Essential (primary) hypertension: Secondary | ICD-10-CM | POA: Diagnosis not present

## 2017-09-18 DIAGNOSIS — J040 Acute laryngitis: Secondary | ICD-10-CM | POA: Diagnosis not present

## 2017-10-30 ENCOUNTER — Encounter: Payer: Commercial Managed Care - HMO | Admitting: Family Medicine

## 2017-12-19 ENCOUNTER — Encounter: Payer: Self-pay | Admitting: Family Medicine

## 2018-08-19 DIAGNOSIS — J029 Acute pharyngitis, unspecified: Secondary | ICD-10-CM | POA: Diagnosis not present

## 2018-09-01 DIAGNOSIS — Z1231 Encounter for screening mammogram for malignant neoplasm of breast: Secondary | ICD-10-CM | POA: Diagnosis not present

## 2018-09-02 LAB — HM MAMMOGRAPHY

## 2019-04-28 DIAGNOSIS — M9903 Segmental and somatic dysfunction of lumbar region: Secondary | ICD-10-CM | POA: Diagnosis not present

## 2019-04-28 DIAGNOSIS — M9902 Segmental and somatic dysfunction of thoracic region: Secondary | ICD-10-CM | POA: Diagnosis not present

## 2019-04-28 DIAGNOSIS — M9901 Segmental and somatic dysfunction of cervical region: Secondary | ICD-10-CM | POA: Diagnosis not present

## 2019-04-28 DIAGNOSIS — M9907 Segmental and somatic dysfunction of upper extremity: Secondary | ICD-10-CM | POA: Diagnosis not present

## 2019-05-03 DIAGNOSIS — M9901 Segmental and somatic dysfunction of cervical region: Secondary | ICD-10-CM | POA: Diagnosis not present

## 2019-05-03 DIAGNOSIS — M9902 Segmental and somatic dysfunction of thoracic region: Secondary | ICD-10-CM | POA: Diagnosis not present

## 2019-05-03 DIAGNOSIS — M9907 Segmental and somatic dysfunction of upper extremity: Secondary | ICD-10-CM | POA: Diagnosis not present

## 2019-05-03 DIAGNOSIS — M9903 Segmental and somatic dysfunction of lumbar region: Secondary | ICD-10-CM | POA: Diagnosis not present

## 2019-05-04 DIAGNOSIS — M9907 Segmental and somatic dysfunction of upper extremity: Secondary | ICD-10-CM | POA: Diagnosis not present

## 2019-05-04 DIAGNOSIS — M9901 Segmental and somatic dysfunction of cervical region: Secondary | ICD-10-CM | POA: Diagnosis not present

## 2019-05-04 DIAGNOSIS — M9902 Segmental and somatic dysfunction of thoracic region: Secondary | ICD-10-CM | POA: Diagnosis not present

## 2019-05-04 DIAGNOSIS — M9903 Segmental and somatic dysfunction of lumbar region: Secondary | ICD-10-CM | POA: Diagnosis not present

## 2019-05-05 DIAGNOSIS — M9907 Segmental and somatic dysfunction of upper extremity: Secondary | ICD-10-CM | POA: Diagnosis not present

## 2019-05-05 DIAGNOSIS — M9901 Segmental and somatic dysfunction of cervical region: Secondary | ICD-10-CM | POA: Diagnosis not present

## 2019-05-05 DIAGNOSIS — M9902 Segmental and somatic dysfunction of thoracic region: Secondary | ICD-10-CM | POA: Diagnosis not present

## 2019-05-05 DIAGNOSIS — M9903 Segmental and somatic dysfunction of lumbar region: Secondary | ICD-10-CM | POA: Diagnosis not present

## 2019-05-06 DIAGNOSIS — I459 Conduction disorder, unspecified: Secondary | ICD-10-CM | POA: Diagnosis not present

## 2019-05-06 DIAGNOSIS — R350 Frequency of micturition: Secondary | ICD-10-CM | POA: Diagnosis not present

## 2019-05-06 DIAGNOSIS — M4810 Ankylosing hyperostosis [Forestier], site unspecified: Secondary | ICD-10-CM | POA: Diagnosis not present

## 2019-05-06 DIAGNOSIS — R002 Palpitations: Secondary | ICD-10-CM | POA: Diagnosis not present

## 2019-05-06 DIAGNOSIS — R3915 Urgency of urination: Secondary | ICD-10-CM | POA: Diagnosis not present

## 2019-05-11 DIAGNOSIS — M9907 Segmental and somatic dysfunction of upper extremity: Secondary | ICD-10-CM | POA: Diagnosis not present

## 2019-05-11 DIAGNOSIS — M9901 Segmental and somatic dysfunction of cervical region: Secondary | ICD-10-CM | POA: Diagnosis not present

## 2019-05-11 DIAGNOSIS — M9902 Segmental and somatic dysfunction of thoracic region: Secondary | ICD-10-CM | POA: Diagnosis not present

## 2019-05-11 DIAGNOSIS — M9903 Segmental and somatic dysfunction of lumbar region: Secondary | ICD-10-CM | POA: Diagnosis not present

## 2019-05-12 DIAGNOSIS — M9907 Segmental and somatic dysfunction of upper extremity: Secondary | ICD-10-CM | POA: Diagnosis not present

## 2019-05-12 DIAGNOSIS — M9902 Segmental and somatic dysfunction of thoracic region: Secondary | ICD-10-CM | POA: Diagnosis not present

## 2019-05-12 DIAGNOSIS — M9901 Segmental and somatic dysfunction of cervical region: Secondary | ICD-10-CM | POA: Diagnosis not present

## 2019-05-12 DIAGNOSIS — M9903 Segmental and somatic dysfunction of lumbar region: Secondary | ICD-10-CM | POA: Diagnosis not present

## 2019-05-13 DIAGNOSIS — R002 Palpitations: Secondary | ICD-10-CM | POA: Diagnosis not present

## 2019-05-13 DIAGNOSIS — I10 Essential (primary) hypertension: Secondary | ICD-10-CM | POA: Diagnosis not present

## 2019-05-18 DIAGNOSIS — M9903 Segmental and somatic dysfunction of lumbar region: Secondary | ICD-10-CM | POA: Diagnosis not present

## 2019-05-18 DIAGNOSIS — M9907 Segmental and somatic dysfunction of upper extremity: Secondary | ICD-10-CM | POA: Diagnosis not present

## 2019-05-18 DIAGNOSIS — M9901 Segmental and somatic dysfunction of cervical region: Secondary | ICD-10-CM | POA: Diagnosis not present

## 2019-05-18 DIAGNOSIS — M9902 Segmental and somatic dysfunction of thoracic region: Secondary | ICD-10-CM | POA: Diagnosis not present

## 2019-05-19 DIAGNOSIS — M9902 Segmental and somatic dysfunction of thoracic region: Secondary | ICD-10-CM | POA: Diagnosis not present

## 2019-05-19 DIAGNOSIS — M9907 Segmental and somatic dysfunction of upper extremity: Secondary | ICD-10-CM | POA: Diagnosis not present

## 2019-05-19 DIAGNOSIS — M9903 Segmental and somatic dysfunction of lumbar region: Secondary | ICD-10-CM | POA: Diagnosis not present

## 2019-05-19 DIAGNOSIS — M9901 Segmental and somatic dysfunction of cervical region: Secondary | ICD-10-CM | POA: Diagnosis not present

## 2019-05-20 DIAGNOSIS — R002 Palpitations: Secondary | ICD-10-CM | POA: Diagnosis not present

## 2019-05-24 DIAGNOSIS — M9901 Segmental and somatic dysfunction of cervical region: Secondary | ICD-10-CM | POA: Diagnosis not present

## 2019-05-24 DIAGNOSIS — M9902 Segmental and somatic dysfunction of thoracic region: Secondary | ICD-10-CM | POA: Diagnosis not present

## 2019-05-24 DIAGNOSIS — M9903 Segmental and somatic dysfunction of lumbar region: Secondary | ICD-10-CM | POA: Diagnosis not present

## 2019-05-24 DIAGNOSIS — M9907 Segmental and somatic dysfunction of upper extremity: Secondary | ICD-10-CM | POA: Diagnosis not present

## 2019-05-26 DIAGNOSIS — M9902 Segmental and somatic dysfunction of thoracic region: Secondary | ICD-10-CM | POA: Diagnosis not present

## 2019-05-26 DIAGNOSIS — M9907 Segmental and somatic dysfunction of upper extremity: Secondary | ICD-10-CM | POA: Diagnosis not present

## 2019-05-26 DIAGNOSIS — M9901 Segmental and somatic dysfunction of cervical region: Secondary | ICD-10-CM | POA: Diagnosis not present

## 2019-05-26 DIAGNOSIS — M9903 Segmental and somatic dysfunction of lumbar region: Secondary | ICD-10-CM | POA: Diagnosis not present

## 2019-06-01 DIAGNOSIS — M9901 Segmental and somatic dysfunction of cervical region: Secondary | ICD-10-CM | POA: Diagnosis not present

## 2019-06-01 DIAGNOSIS — M9907 Segmental and somatic dysfunction of upper extremity: Secondary | ICD-10-CM | POA: Diagnosis not present

## 2019-06-01 DIAGNOSIS — M9902 Segmental and somatic dysfunction of thoracic region: Secondary | ICD-10-CM | POA: Diagnosis not present

## 2019-06-01 DIAGNOSIS — M9903 Segmental and somatic dysfunction of lumbar region: Secondary | ICD-10-CM | POA: Diagnosis not present

## 2019-06-14 DIAGNOSIS — M9902 Segmental and somatic dysfunction of thoracic region: Secondary | ICD-10-CM | POA: Diagnosis not present

## 2019-06-14 DIAGNOSIS — M9903 Segmental and somatic dysfunction of lumbar region: Secondary | ICD-10-CM | POA: Diagnosis not present

## 2019-06-14 DIAGNOSIS — M9901 Segmental and somatic dysfunction of cervical region: Secondary | ICD-10-CM | POA: Diagnosis not present

## 2019-06-14 DIAGNOSIS — M9907 Segmental and somatic dysfunction of upper extremity: Secondary | ICD-10-CM | POA: Diagnosis not present

## 2019-06-15 DIAGNOSIS — M9907 Segmental and somatic dysfunction of upper extremity: Secondary | ICD-10-CM | POA: Diagnosis not present

## 2019-06-15 DIAGNOSIS — M9903 Segmental and somatic dysfunction of lumbar region: Secondary | ICD-10-CM | POA: Diagnosis not present

## 2019-06-15 DIAGNOSIS — M9902 Segmental and somatic dysfunction of thoracic region: Secondary | ICD-10-CM | POA: Diagnosis not present

## 2019-06-15 DIAGNOSIS — M9901 Segmental and somatic dysfunction of cervical region: Secondary | ICD-10-CM | POA: Diagnosis not present

## 2019-06-16 DIAGNOSIS — M9907 Segmental and somatic dysfunction of upper extremity: Secondary | ICD-10-CM | POA: Diagnosis not present

## 2019-06-16 DIAGNOSIS — M9902 Segmental and somatic dysfunction of thoracic region: Secondary | ICD-10-CM | POA: Diagnosis not present

## 2019-06-16 DIAGNOSIS — M9901 Segmental and somatic dysfunction of cervical region: Secondary | ICD-10-CM | POA: Diagnosis not present

## 2019-06-16 DIAGNOSIS — M9903 Segmental and somatic dysfunction of lumbar region: Secondary | ICD-10-CM | POA: Diagnosis not present

## 2019-06-18 DIAGNOSIS — R002 Palpitations: Secondary | ICD-10-CM | POA: Diagnosis not present

## 2019-06-22 DIAGNOSIS — M9907 Segmental and somatic dysfunction of upper extremity: Secondary | ICD-10-CM | POA: Diagnosis not present

## 2019-06-22 DIAGNOSIS — M9901 Segmental and somatic dysfunction of cervical region: Secondary | ICD-10-CM | POA: Diagnosis not present

## 2019-06-22 DIAGNOSIS — M9903 Segmental and somatic dysfunction of lumbar region: Secondary | ICD-10-CM | POA: Diagnosis not present

## 2019-06-22 DIAGNOSIS — M9902 Segmental and somatic dysfunction of thoracic region: Secondary | ICD-10-CM | POA: Diagnosis not present

## 2019-06-23 DIAGNOSIS — M9907 Segmental and somatic dysfunction of upper extremity: Secondary | ICD-10-CM | POA: Diagnosis not present

## 2019-06-23 DIAGNOSIS — M9902 Segmental and somatic dysfunction of thoracic region: Secondary | ICD-10-CM | POA: Diagnosis not present

## 2019-06-23 DIAGNOSIS — M9903 Segmental and somatic dysfunction of lumbar region: Secondary | ICD-10-CM | POA: Diagnosis not present

## 2019-06-23 DIAGNOSIS — M9901 Segmental and somatic dysfunction of cervical region: Secondary | ICD-10-CM | POA: Diagnosis not present

## 2019-06-24 DIAGNOSIS — I472 Ventricular tachycardia: Secondary | ICD-10-CM | POA: Diagnosis not present

## 2019-06-24 DIAGNOSIS — I491 Atrial premature depolarization: Secondary | ICD-10-CM | POA: Diagnosis not present

## 2019-06-24 DIAGNOSIS — I493 Ventricular premature depolarization: Secondary | ICD-10-CM | POA: Diagnosis not present

## 2019-06-29 DIAGNOSIS — M9903 Segmental and somatic dysfunction of lumbar region: Secondary | ICD-10-CM | POA: Diagnosis not present

## 2019-06-29 DIAGNOSIS — M9902 Segmental and somatic dysfunction of thoracic region: Secondary | ICD-10-CM | POA: Diagnosis not present

## 2019-06-29 DIAGNOSIS — M9907 Segmental and somatic dysfunction of upper extremity: Secondary | ICD-10-CM | POA: Diagnosis not present

## 2019-06-29 DIAGNOSIS — M9901 Segmental and somatic dysfunction of cervical region: Secondary | ICD-10-CM | POA: Diagnosis not present

## 2019-07-01 DIAGNOSIS — F4321 Adjustment disorder with depressed mood: Secondary | ICD-10-CM | POA: Diagnosis not present

## 2019-07-05 DIAGNOSIS — R7301 Impaired fasting glucose: Secondary | ICD-10-CM | POA: Diagnosis not present

## 2019-07-05 DIAGNOSIS — I10 Essential (primary) hypertension: Secondary | ICD-10-CM | POA: Diagnosis not present

## 2019-07-05 DIAGNOSIS — Z0000 Encounter for general adult medical examination without abnormal findings: Secondary | ICD-10-CM | POA: Diagnosis not present

## 2019-07-05 DIAGNOSIS — Z1211 Encounter for screening for malignant neoplasm of colon: Secondary | ICD-10-CM | POA: Diagnosis not present

## 2019-07-08 DIAGNOSIS — F4321 Adjustment disorder with depressed mood: Secondary | ICD-10-CM | POA: Diagnosis not present

## 2019-07-13 DIAGNOSIS — M9907 Segmental and somatic dysfunction of upper extremity: Secondary | ICD-10-CM | POA: Diagnosis not present

## 2019-07-13 DIAGNOSIS — M9901 Segmental and somatic dysfunction of cervical region: Secondary | ICD-10-CM | POA: Diagnosis not present

## 2019-07-13 DIAGNOSIS — M9903 Segmental and somatic dysfunction of lumbar region: Secondary | ICD-10-CM | POA: Diagnosis not present

## 2019-07-13 DIAGNOSIS — M9902 Segmental and somatic dysfunction of thoracic region: Secondary | ICD-10-CM | POA: Diagnosis not present

## 2019-07-14 DIAGNOSIS — F4321 Adjustment disorder with depressed mood: Secondary | ICD-10-CM | POA: Diagnosis not present

## 2019-07-21 DIAGNOSIS — F4321 Adjustment disorder with depressed mood: Secondary | ICD-10-CM | POA: Diagnosis not present

## 2019-07-23 DIAGNOSIS — I10 Essential (primary) hypertension: Secondary | ICD-10-CM | POA: Diagnosis not present

## 2019-07-23 DIAGNOSIS — R002 Palpitations: Secondary | ICD-10-CM | POA: Diagnosis not present

## 2019-08-05 DIAGNOSIS — F4321 Adjustment disorder with depressed mood: Secondary | ICD-10-CM | POA: Diagnosis not present

## 2019-08-26 DIAGNOSIS — F4321 Adjustment disorder with depressed mood: Secondary | ICD-10-CM | POA: Diagnosis not present

## 2019-09-16 DIAGNOSIS — F4321 Adjustment disorder with depressed mood: Secondary | ICD-10-CM | POA: Diagnosis not present

## 2019-10-14 DIAGNOSIS — F4321 Adjustment disorder with depressed mood: Secondary | ICD-10-CM | POA: Diagnosis not present

## 2019-12-02 DIAGNOSIS — F4321 Adjustment disorder with depressed mood: Secondary | ICD-10-CM | POA: Diagnosis not present

## 2019-12-28 DIAGNOSIS — F4321 Adjustment disorder with depressed mood: Secondary | ICD-10-CM | POA: Diagnosis not present

## 2020-01-05 ENCOUNTER — Encounter (HOSPITAL_COMMUNITY): Payer: Self-pay

## 2020-01-05 ENCOUNTER — Other Ambulatory Visit: Payer: Self-pay

## 2020-01-05 ENCOUNTER — Emergency Department (HOSPITAL_COMMUNITY)
Admission: EM | Admit: 2020-01-05 | Discharge: 2020-01-05 | Disposition: A | Discharge status: Home / Self Care | Payer: Federal, State, Local not specified - PPO | Attending: Emergency Medicine | Admitting: Emergency Medicine

## 2020-01-05 ENCOUNTER — Emergency Department (HOSPITAL_COMMUNITY): Payer: Federal, State, Local not specified - PPO

## 2020-01-05 DIAGNOSIS — Y998 Other external cause status: Secondary | ICD-10-CM | POA: Insufficient documentation

## 2020-01-05 DIAGNOSIS — Z23 Encounter for immunization: Secondary | ICD-10-CM | POA: Insufficient documentation

## 2020-01-05 DIAGNOSIS — S62609A Fracture of unspecified phalanx of unspecified finger, initial encounter for closed fracture: Secondary | ICD-10-CM | POA: Diagnosis not present

## 2020-01-05 DIAGNOSIS — Y929 Unspecified place or not applicable: Secondary | ICD-10-CM | POA: Insufficient documentation

## 2020-01-05 DIAGNOSIS — W19XXXA Unspecified fall, initial encounter: Secondary | ICD-10-CM | POA: Diagnosis not present

## 2020-01-05 DIAGNOSIS — S6991XA Unspecified injury of right wrist, hand and finger(s), initial encounter: Secondary | ICD-10-CM | POA: Diagnosis not present

## 2020-01-05 DIAGNOSIS — S62616A Displaced fracture of proximal phalanx of right little finger, initial encounter for closed fracture: Secondary | ICD-10-CM | POA: Diagnosis not present

## 2020-01-05 DIAGNOSIS — Y9389 Activity, other specified: Secondary | ICD-10-CM | POA: Diagnosis not present

## 2020-01-05 DIAGNOSIS — S61216A Laceration without foreign body of right little finger without damage to nail, initial encounter: Secondary | ICD-10-CM | POA: Diagnosis not present

## 2020-01-05 DIAGNOSIS — S63286A Dislocation of proximal interphalangeal joint of right little finger, initial encounter: Secondary | ICD-10-CM | POA: Diagnosis not present

## 2020-01-05 MED ORDER — TETANUS-DIPHTH-ACELL PERTUSSIS 5-2.5-18.5 LF-MCG/0.5 IM SUSP
0.5000 mL | Freq: Once | INTRAMUSCULAR | Status: AC
  Administered 2020-01-05: 0.5 mL via INTRAMUSCULAR
  Filled 2020-01-05: qty 0.5

## 2020-01-05 MED ORDER — IBUPROFEN 800 MG PO TABS
800.0000 mg | ORAL_TABLET | Freq: Once | ORAL | Status: AC
  Administered 2020-01-05: 800 mg via ORAL
  Filled 2020-01-05: qty 1

## 2020-01-05 MED ORDER — LIDOCAINE-EPINEPHRINE (PF) 2 %-1:200000 IJ SOLN
10.0000 mL | Freq: Once | INTRAMUSCULAR | Status: AC
  Administered 2020-01-05: 10 mL via INTRADERMAL
  Filled 2020-01-05: qty 10

## 2020-01-05 MED ORDER — CEPHALEXIN 500 MG PO CAPS: 500.0000 mg | ORAL_CAPSULE | Freq: Three times a day (TID) | ORAL | 0 refills | Status: AC

## 2020-01-05 MED ORDER — LIDOCAINE HCL 2 % IJ SOLN
INTRAMUSCULAR | Status: AC
  Filled 2020-01-05: qty 20

## 2020-01-05 NOTE — ED Triage Notes (Signed)
Patient states she was walking her dog and tripped on an uneven sidewalk. Patient has deformity to the right little finger.

## 2020-01-05 NOTE — ED Provider Notes (Signed)
[facility]-EMERGENCY DEPT Provider Note   CSN: [id number] Arrival date & time: 01/05/20  9024     History Chief Complaint  Patient presents with  . Fall    Susan Sutton is a 49 y.o. female with no relevant PMH presents to the ED with right fifth finger pain subsequent to mechanical fall.  Patient reports that she landed on her outstretched right hand and immediately noticed a deformity to her right pinky finger.  She presents to the ED complaining of significant pain and discomfort.  There is a wound on the volar aspect of her affected finger.  She denies any numbness or other injury.  Last tetanus unknown.  HPI     Past Medical History:  Diagnosis Date  . Hepatitis    "b"    Patient Active Problem List   Diagnosis Date Noted  . Adiposity 12/28/2015  . Gonalgia 01/25/2014  . Hepatitis 05/27/2010  . Allergic rhinitis 03/13/2009  . Anemia, iron deficiency 12/25/2006    Past Surgical History:  Procedure Laterality Date  . KNEE ARTHROSCOPY Right 08/22/2014   Procedure: ARTHROSCOPY KNEE;  Surgeon: Dannielle Huh, MD;  Location: [facility] OR;  Service: Orthopedics;  Laterality: Right;  Partial medial menisectomy     OB History   No obstetric history on file.     Family History  Problem Relation Age of Onset  . Heart attack Mother   . Hypertension Mother   . Congestive Heart Failure Mother   . Diabetes Mother   . Diabetes Father   . Hypertension Father   . Heart attack Father   . Congestive Heart Failure Father   . Diabetes Sister   . Hypertension Sister   . Hyperlipidemia Sister   . Lupus Sister   . Colon polyps Sister   . Hypertension Brother   . Breast cancer Paternal Grandmother   . Breast cancer Paternal Aunt     Social History   Tobacco Use  . Smoking status: Never Smoker  . Smokeless tobacco: Never Used  Substance Use Topics  . Alcohol use: Yes    Comment: occ wine  . Drug use: No    Home Medications Prior to Admission medications    Medication Sig Start Date End Date Taking? Authorizing Provider  cephALEXin (KEFLEX) 500 MG capsule Take 1 capsule (500 mg total) by mouth 3 (three) times daily for 7 days. 01/05/20 01/12/20  Lorelee New, PA-C  cetirizine (ZYRTEC ALLERGY) 10 MG tablet Take 10 mg by mouth as needed for allergies.    [provider]  guaiFENesin (MUCINEX) 600 MG 12 hr tablet Take 600 mg by mouth as needed.    [provider]  ibuprofen (ADVIL,MOTRIN) 200 MG tablet Take 4 tablets (800 mg total) by mouth 3 (three) times daily. 08/22/14   Altamese Cabal, PA-C    Allergies    Patient has no known allergies.  Review of Systems   Review of Systems  Musculoskeletal: Positive for arthralgias.  Skin: Positive for wound.  Neurological: Negative for numbness.  Hematological: Does not bruise/bleed easily.    Physical Exam Updated Vital Signs BP (!) 216/108 (BP Location: Left Arm)   Pulse 86   Temp 98 F (36.7 C) (Oral)   Resp 18   Ht 5\' 7"  (1.702 m)   Wt (!) 138.3 kg   LMP 01/01/2020 (Approximate)   SpO2 96%   BMI 47.77 kg/m   Physical Exam Vitals and nursing note reviewed. Exam conducted with a chaperone present.  Constitutional:      Appearance: Normal appearance.  HENT:     Head: Normocephalic and atraumatic.  Eyes:     General: No scleral icterus.    Conjunctiva/sclera: Conjunctivae normal.  Cardiovascular:     Rate and Rhythm: Normal rate and regular rhythm.     Pulses: Normal pulses.     Heart sounds: Normal heart sounds.  Pulmonary:     Effort: Pulmonary effort is normal. No respiratory distress.     Breath sounds: Normal breath sounds.  Musculoskeletal:     Cervical back: Normal range of motion and neck supple.     Comments: Right hand: Obvious deformity to fifth finger PIP joint.  Bleeding laceration over volar aspect of affected finger.  Sensation intact distally. Post reduction: Able to better visualize laceration and will require suture repair.  Patient is able  to flex and extend affected finger against resistance.  Sensation remains intact.  Skin:    General: Skin is dry.     Capillary Refill: Capillary refill takes less than 2 seconds.  Neurological:     Mental Status: She is alert.     GCS: GCS eye subscore is 4. GCS verbal subscore is 5. GCS motor subscore is 6.  Psychiatric:        Mood and Affect: Mood normal.        Behavior: Behavior normal.        Thought Content: Thought content normal.     ED Results / Procedures / Treatments   Labs (all labs ordered are listed, but only abnormal results are displayed) Labs Reviewed - No data to display  EKG None  Radiology DG Finger Little Right  Result Date: 01/05/2020 CLINICAL DATA:  Postreduction EXAM: RIGHT LITTLE FINGER 2+V COMPARISON:  01/05/2020 FINDINGS: Interval reduction of the previously seen dislocated right little finger PIP joint. Small avulsed fragments noted. Normal alignment. IMPRESSION: Interval reduction of the PIP dislocation. Small avulsed fragments again noted anteriorly. Electronically Signed   By: Charlett Nose M.D.   On: 01/05/2020 10:41   DG Finger Little Right  Result Date: 01/05/2020 CLINICAL DATA:  Right little finger pain and deformity secondary to a fall today. EXAM: RIGHT LITTLE FINGER 2+V COMPARISON:  None. FINDINGS: There is dorsal dislocation at the PIP joint of right little finger. There is a small avulsion of bone along the radial aspect of the PIP joint. This is probably from the volar aspect of the base of the middle phalanx. IMPRESSION: Dorsal dislocation of the PIP joint of the right little finger with a small avulsion fracture. Electronically Signed   By: Francene Boyers M.D.   On: 01/05/2020 10:05    Procedures .Marland KitchenLaceration Repair  Date/Time: 01/05/2020 11:50 AM Performed by: Lorelee New, PA-C Authorized by: Lorelee New, PA-C   Consent:    Consent obtained:  Verbal   Consent given by:  Patient   Risks discussed:  Infection, need for  additional repair, pain, poor cosmetic result, poor wound healing, nerve damage, retained foreign body, tendon damage and vascular damage   Alternatives discussed:  No treatment and delayed treatment Universal protocol:    Procedure explained and questions answered to patient or proxy's satisfaction: yes     Relevant documents present and verified: yes     Test results available and properly labeled: yes     Imaging studies available: yes     Required blood products, implants, devices, and special equipment available: yes     Site/side marked: yes  Immediately prior to procedure, a time out was called: yes     Patient identity confirmed:  Verbally with patient Anesthesia (see MAR for exact dosages):    Anesthesia method:  Local infiltration   Local anesthetic:  Lidocaine 2% w/o epi Laceration details:    Location:  Finger   Finger location:  R small finger   Length (cm):  4 Repair type:    Repair type:  Simple Pre-procedure details:    Preparation:  Patient was prepped and draped in usual sterile fashion and imaging obtained to evaluate for foreign bodies Exploration:    Hemostasis achieved with:  Direct pressure   Wound exploration: wound explored through full range of motion   Treatment:    Area cleansed with:  Shur-Clens and saline   Amount of cleaning:  Extensive   Irrigation solution:  Sterile saline   Irrigation volume:  500 mL   Irrigation method:  Pressure wash   Visualized foreign bodies/material removed: no   Skin repair:    Repair method:  Sutures   Suture size:  5-0   Suture material:  Prolene   Number of sutures:  4 Post-procedure details:    Dressing:  Splint for protection   Patient tolerance of procedure:  Tolerated well, no immediate complications Reduction of dislocation  Date/Time: 01/05/2020 11:52 AM Performed by: Lorelee New, PA-C Authorized by: Lorelee New, PA-C  Consent: Verbal consent obtained. Consent given by: patient Patient  understanding: patient states understanding of the procedure being performed Patient consent: the patient's understanding of the procedure matches consent given Procedure consent: procedure consent matches procedure scheduled Relevant documents: relevant documents present and verified Test results: test results available and properly labeled Site marked: the operative site was marked Imaging studies: imaging studies available Patient identity confirmed: verbally with patient Time out: Immediately prior to procedure a "time out" was called to verify the correct patient, procedure, equipment, support staff and site/side marked as required. Local anesthesia used: no  Anesthesia: Local anesthesia used: no  Sedation: Patient sedated: no  Patient tolerance: patient tolerated the procedure well with no immediate complications    (including critical care time)  Medications Ordered in ED Medications  lidocaine (XYLOCAINE) 2 % (with pres) injection (has no administration in time range)  ibuprofen (ADVIL) tablet 800 mg (800 mg Oral Given 01/05/20 1030)  lidocaine-EPINEPHrine (XYLOCAINE W/EPI) 2 %-1:200000 (PF) injection 10 mL (10 mLs Intradermal Given 01/05/20 1037)  Tdap (BOOSTRIX) injection 0.5 mL (0.5 mLs Intramuscular Given 01/05/20 1042)    ED Course  I have reviewed the triage vital signs and the nursing notes.  Pertinent labs & imaging results that were available during my care of the patient were reviewed by me and considered in my medical decision making (see chart for details).    MDM Rules/Calculators/A&P                      Plain films revealed PIP dislocation on fifth finger right hand as well as a small avulsion fracture.  Reduced without anesthesia required here in the ED.  Flexor and extensor strength intact and against resistance.  Sensation intact throughout.  Laceration on volar aspect of affected finger continued to bleed and required placement of 4 5-0 prolene sutures.   Safe removal in 10 days.  Will provide wound care instructions.  Tetanus updated here in the ED.  Patient tolerated procedures well.  Elevated blood pressure here in the ED is likely attributed to her significant  pain and discomfort.  Improved with ibuprofen.  Prescribing Keflex and referring to hand surgery for ongoing evaluation and management.  Will place in splint for protection.  Strict ED return precautions discussed with the patient.  Patient voiced understanding and is agreeable to the plan.   Final Clinical Impression(s) / ED Diagnoses Final diagnoses:  Closed fracture dislocation of proximal interphalangeal (PIP) joint of finger, initial encounter    Rx / DC Orders ED Discharge Orders         Ordered    cephALEXin (KEFLEX) 500 MG capsule  3 times daily     01/05/20 1157           Reita Chard 01/05/20 1158    Virgel Manifold, MD 01/09/20 1049

## 2020-01-05 NOTE — Discharge Instructions (Addendum)
Your sutures will need to come out in 10 days.  Please contact the office of Dr. Orlan Leavens to schedule an appointment for evaluation in 2 days.  Over-the-counter NSAIDs as needed for symptoms of discomfort.  You may also take Tylenol.  Splint is worn for  protection.  Please take your Keflex, as prescribed.  Return to the ED or seek immediate medical attention should you develop any new or worsening symptoms.

## 2020-01-05 NOTE — ED Notes (Signed)
Ortho called to place splint. 

## 2020-01-06 DIAGNOSIS — S63286A Dislocation of proximal interphalangeal joint of right little finger, initial encounter: Secondary | ICD-10-CM | POA: Diagnosis not present

## 2020-01-06 DIAGNOSIS — M79644 Pain in right finger(s): Secondary | ICD-10-CM | POA: Diagnosis not present

## 2020-01-13 DIAGNOSIS — M79644 Pain in right finger(s): Secondary | ICD-10-CM | POA: Diagnosis not present

## 2020-01-24 DIAGNOSIS — I10 Essential (primary) hypertension: Secondary | ICD-10-CM | POA: Diagnosis not present

## 2020-01-24 DIAGNOSIS — R002 Palpitations: Secondary | ICD-10-CM | POA: Diagnosis not present

## 2020-02-03 DIAGNOSIS — S63286A Dislocation of proximal interphalangeal joint of right little finger, initial encounter: Secondary | ICD-10-CM | POA: Diagnosis not present

## 2020-02-11 DIAGNOSIS — E669 Obesity, unspecified: Secondary | ICD-10-CM | POA: Diagnosis not present

## 2020-02-11 DIAGNOSIS — D509 Iron deficiency anemia, unspecified: Secondary | ICD-10-CM | POA: Diagnosis not present

## 2020-02-11 DIAGNOSIS — I10 Essential (primary) hypertension: Secondary | ICD-10-CM | POA: Diagnosis not present

## 2020-03-02 DIAGNOSIS — S63286A Dislocation of proximal interphalangeal joint of right little finger, initial encounter: Secondary | ICD-10-CM | POA: Diagnosis not present

## 2020-05-12 DIAGNOSIS — R7301 Impaired fasting glucose: Secondary | ICD-10-CM | POA: Diagnosis not present

## 2020-05-12 DIAGNOSIS — S63286A Dislocation of proximal interphalangeal joint of right little finger, initial encounter: Secondary | ICD-10-CM | POA: Diagnosis not present

## 2020-05-12 DIAGNOSIS — Z202 Contact with and (suspected) exposure to infections with a predominantly sexual mode of transmission: Secondary | ICD-10-CM | POA: Diagnosis not present

## 2020-05-12 DIAGNOSIS — Z1159 Encounter for screening for other viral diseases: Secondary | ICD-10-CM | POA: Diagnosis not present

## 2020-05-12 DIAGNOSIS — I10 Essential (primary) hypertension: Secondary | ICD-10-CM | POA: Diagnosis not present

## 2020-05-12 DIAGNOSIS — E559 Vitamin D deficiency, unspecified: Secondary | ICD-10-CM | POA: Diagnosis not present

## 2020-05-12 DIAGNOSIS — R5382 Chronic fatigue, unspecified: Secondary | ICD-10-CM | POA: Diagnosis not present

## 2020-05-12 DIAGNOSIS — Z0000 Encounter for general adult medical examination without abnormal findings: Secondary | ICD-10-CM | POA: Diagnosis not present

## 2020-05-24 DIAGNOSIS — R748 Abnormal levels of other serum enzymes: Secondary | ICD-10-CM | POA: Diagnosis not present

## 2020-05-24 DIAGNOSIS — E559 Vitamin D deficiency, unspecified: Secondary | ICD-10-CM | POA: Diagnosis not present

## 2020-05-24 DIAGNOSIS — I10 Essential (primary) hypertension: Secondary | ICD-10-CM | POA: Diagnosis not present

## 2020-05-24 DIAGNOSIS — R7301 Impaired fasting glucose: Secondary | ICD-10-CM | POA: Diagnosis not present

## 2020-06-16 ENCOUNTER — Other Ambulatory Visit: Payer: Self-pay

## 2020-06-16 ENCOUNTER — Emergency Department (HOSPITAL_BASED_OUTPATIENT_CLINIC_OR_DEPARTMENT_OTHER)
Admission: EM | Admit: 2020-06-16 | Discharge: 2020-06-17 | Disposition: A | Discharge status: Home / Self Care | Payer: Federal, State, Local not specified - PPO | Attending: Emergency Medicine | Admitting: Emergency Medicine

## 2020-06-16 ENCOUNTER — Encounter (HOSPITAL_BASED_OUTPATIENT_CLINIC_OR_DEPARTMENT_OTHER): Payer: Self-pay

## 2020-06-16 DIAGNOSIS — K047 Periapical abscess without sinus: Secondary | ICD-10-CM

## 2020-06-16 DIAGNOSIS — R030 Elevated blood-pressure reading, without diagnosis of hypertension: Secondary | ICD-10-CM | POA: Diagnosis not present

## 2020-06-16 DIAGNOSIS — Z79899 Other long term (current) drug therapy: Secondary | ICD-10-CM | POA: Diagnosis not present

## 2020-06-16 DIAGNOSIS — I10 Essential (primary) hypertension: Secondary | ICD-10-CM | POA: Diagnosis not present

## 2020-06-16 NOTE — ED Triage Notes (Addendum)
Pt c/o "tooth abscess" started yesterday-states her dentist called in abx yesterday/amoxil x 5 doses-area worse with left side facial swelling-was advised to come to ED-NAD-steady gait

## 2020-06-17 MED ORDER — FLUCONAZOLE 150 MG PO TABS: 150.0000 mg | ORAL_TABLET | Freq: Every day | ORAL | 0 refills | Status: DC

## 2020-06-17 MED ORDER — HYDROCODONE-ACETAMINOPHEN 5-325 MG PO TABS: 1.0000 | ORAL_TABLET | ORAL | 0 refills | Status: DC | PRN

## 2020-06-17 MED ORDER — CLINDAMYCIN HCL 150 MG PO CAPS: 150.0000 mg | ORAL_CAPSULE | Freq: Four times a day (QID) | ORAL | 0 refills | Status: DC

## 2020-06-17 MED ORDER — CLINDAMYCIN HCL 150 MG PO CAPS
150.0000 mg | ORAL_CAPSULE | Freq: Once | ORAL | Status: AC
  Administered 2020-06-17: 150 mg via ORAL
  Filled 2020-06-17: qty 1

## 2020-06-17 MED ORDER — LIDOCAINE-EPINEPHRINE (PF) 2 %-1:200000 IJ SOLN
10.0000 mL | Freq: Once | INTRAMUSCULAR | Status: AC
  Administered 2020-06-17: 10 mL

## 2020-06-17 MED ORDER — LIDOCAINE-EPINEPHRINE (PF) 2 %-1:200000 IJ SOLN
INTRAMUSCULAR | Status: AC
  Filled 2020-06-17: qty 20

## 2020-06-17 NOTE — ED Notes (Signed)
Pt requesting pain medicine and yeast infection medication.  MD. Aware.

## 2020-06-17 NOTE — ED Provider Notes (Addendum)
[facility] EMERGENCY DEPARTMENT Provider Note   CSN: [id number] Arrival date & time: 06/16/20  2213    History Chief Complaint  Patient presents with  . Abscess    Susan Sutton is a 49 y.o. female.  The history is provided by the patient.  Abscess She has had a dental abscess and a left lower molar for the last 4 months.  Her dentist told her that she would need a root canal, and she has been trying to get the money together to have that done.  Pain has waxed and waned.  She started having more pain yesterday and her dentist started her on amoxicillin.  She has taken 5 doses thus far.  She woke up this morning and noted swelling in the left side of her jaw and called her dentist who told her that she needed to go to the emergency department.  Past Medical History:  Diagnosis Date  . Hepatitis    "b"    Patient Active Problem List   Diagnosis Date Noted  . Adiposity 12/28/2015  . Gonalgia 01/25/2014  . Hepatitis 05/27/2010  . Allergic rhinitis 03/13/2009  . Anemia, iron deficiency 12/25/2006    Past Surgical History:  Procedure Laterality Date  . KNEE ARTHROSCOPY Right 08/22/2014   Procedure: ARTHROSCOPY KNEE;  Surgeon: Dannielle Huh, MD;  Location: [facility] OR;  Service: Orthopedics;  Laterality: Right;  Partial medial menisectomy     OB History   No obstetric history on file.     Family History  Problem Relation Age of Onset  . Heart attack Mother   . Hypertension Mother   . Congestive Heart Failure Mother   . Diabetes Mother   . Diabetes Father   . Hypertension Father   . Heart attack Father   . Congestive Heart Failure Father   . Diabetes Sister   . Hypertension Sister   . Hyperlipidemia Sister   . Lupus Sister   . Colon polyps Sister   . Hypertension Brother   . Breast cancer Paternal Grandmother   . Breast cancer Paternal Aunt     Social History   Tobacco Use  . Smoking status: Never Smoker  . Smokeless tobacco: Never Used  Vaping Use  .  Vaping Use: Never used  Substance Use Topics  . Alcohol use: Yes    Comment: occ  . Drug use: No    Home Medications Prior to Admission medications   Medication Sig Start Date End Date Taking? Authorizing Provider  acetaminophen-codeine (TYLENOL #3) 300-30 MG tablet Take 1 tablet by mouth every 4 (four) hours as needed. 02/15/20   [provider]  amoxicillin (AMOXIL) 500 MG capsule Take 500 mg by mouth 3 (three) times daily. 06/15/20   [provider]  cetirizine (ZYRTEC ALLERGY) 10 MG tablet Take 10 mg by mouth as needed for allergies.    [provider]  guaiFENesin (MUCINEX) 600 MG 12 hr tablet Take 600 mg by mouth as needed.    [provider]  ibuprofen (ADVIL,MOTRIN) 200 MG tablet Take 4 tablets (800 mg total) by mouth 3 (three) times daily. 08/22/14   Altamese Cabal, PA-C  metoprolol succinate (TOPROL-XL) 50 MG 24 hr tablet Take 50 mg by mouth daily. 05/24/20   [provider]    Allergies    Patient has no known allergies.  Review of Systems   Review of Systems  All other systems reviewed and are negative.   Physical Exam Updated Vital Signs BP (!) 163/78 (  BP Location: Right Arm)   Pulse 78   Temp 98.8 F (37.1 C) (Oral)   Resp 20   Ht 5\' 7"  (1.702 m)   Wt (!) 139.7 kg   LMP 06/16/2020   SpO2 99%   BMI 48.24 kg/m   Physical Exam Vitals and nursing note reviewed.   49 year old female, resting comfortably and in no acute distress. Vital signs are significant for elevated blood pressure. Oxygen saturation is 99%, which is normal. Head is normocephalic and atraumatic. PERRLA, EOMI. Oropharynx is clear.  There is swelling of the left side of the jaw.  There is mild pallor and swelling of the gingiva around tooth #19, with marked tenderness. Neck is nontender and supple without adenopathy or JVD. Back is nontender and there is no CVA tenderness. Lungs are clear without rales, wheezes, or rhonchi. Chest is nontender. Heart  has regular rate and rhythm without murmur. Abdomen is soft, flat, nontender without masses or hepatosplenomegaly and peristalsis is normoactive. Extremities have trace edema, full range of motion is present. Skin is warm and dry without rash. Neurologic: Mental status is normal, cranial nerves are intact, there are no motor or sensory deficits.  ED Results / Procedures / Treatments    Procedures .57Incision and Drainage  Date/Time: 06/17/2020 1:04 AM Performed by: 06/19/2020, MD Authorized by: Dione Booze, MD   Consent:    Consent obtained:  Verbal   Consent given by:  Patient   Risks discussed:  Bleeding, incomplete drainage and pain   Alternatives discussed:  No treatment Location:    Type:  Abscess   Location:  Mouth   Mouth location: dental. Anesthesia (see MAR for exact dosages):    Anesthesia method:  Local infiltration   Local anesthetic:  Lidocaine 2% WITH epi Procedure type:    Complexity:  Simple Procedure details:    Needle aspiration: yes     Needle size:  18 G   Drainage:  Bloody and purulent   Drainage amount:  Scant   Packing materials:  None Post-procedure details:    Patient tolerance of procedure:  Tolerated well, no immediate complications     Medications Ordered in ED Medications  lidocaine-EPINEPHrine (XYLOCAINE W/EPI) 2 %-1:200000 (PF) injection (has no administration in time range)  clindamycin (CLEOCIN) capsule 150 mg (has no administration in time range)  lidocaine-EPINEPHrine (XYLOCAINE W/EPI) 2 %-1:200000 (PF) injection 10 mL (10 mLs Infiltration Given 06/17/20 0040)    ED Course  I have reviewed the triage vital signs and the nursing notes.  MDM Rules/Calculators/A&P Dental abscess.  Old records are reviewed, and she has no relevant past visits.  Gingiva around the tooth is anesthetized with lidocaine and abscesses punctured with 18-gauge needle.  A small amount of pus was expressed.  Since she failed amoxicillin, she will be switched to  clindamycin.  She is referred back to her dentist, advised that she needs definitive treatment for the abscess which would include either root canal or extraction.  Final Clinical Impression(s) / ED Diagnoses Final diagnoses:  Dental abscess  Elevated blood pressure reading with diagnosis of hypertension    Rx / DC Orders ED Discharge Orders         Ordered    clindamycin (CLEOCIN) 150 MG capsule  Every 6 hours     Discontinue  Reprint     06/17/20 0102           06/19/20, MD 06/17/20 0106   Patient requested something for pain and something  for yeast infection.  She is given a prescription for small number of hydrocodone-acetaminophen tablets and also a prescription for fluconazole to take after completing the course of antibiotics.   Dione Booze, MD 06/17/20 [phone]

## 2020-06-17 NOTE — Discharge Instructions (Addendum)
The abscess will not get better until you have a root canal, or have the tooth pulled.

## 2020-07-21 DIAGNOSIS — R002 Palpitations: Secondary | ICD-10-CM | POA: Diagnosis not present

## 2020-07-21 DIAGNOSIS — I10 Essential (primary) hypertension: Secondary | ICD-10-CM | POA: Diagnosis not present

## 2020-07-26 DIAGNOSIS — N926 Irregular menstruation, unspecified: Secondary | ICD-10-CM | POA: Diagnosis not present

## 2020-07-26 DIAGNOSIS — Z1331 Encounter for screening for depression: Secondary | ICD-10-CM | POA: Diagnosis not present

## 2020-07-26 DIAGNOSIS — M79662 Pain in left lower leg: Secondary | ICD-10-CM | POA: Diagnosis not present

## 2020-07-26 DIAGNOSIS — R600 Localized edema: Secondary | ICD-10-CM | POA: Diagnosis not present

## 2020-07-26 DIAGNOSIS — E039 Hypothyroidism, unspecified: Secondary | ICD-10-CM | POA: Diagnosis not present

## 2020-07-26 DIAGNOSIS — R2243 Localized swelling, mass and lump, lower limb, bilateral: Secondary | ICD-10-CM | POA: Diagnosis not present

## 2020-07-27 ENCOUNTER — Other Ambulatory Visit: Payer: Self-pay | Admitting: Nurse Practitioner

## 2020-07-27 DIAGNOSIS — M79662 Pain in left lower leg: Secondary | ICD-10-CM

## 2020-08-01 ENCOUNTER — Ambulatory Visit
Admission: RE | Admit: 2020-08-01 | Discharge: 2020-08-01 | Disposition: A | Discharge status: Home / Self Care | Payer: Federal, State, Local not specified - PPO | Source: Ambulatory Visit | Attending: Nurse Practitioner | Admitting: Nurse Practitioner

## 2020-08-01 DIAGNOSIS — M79662 Pain in left lower leg: Secondary | ICD-10-CM | POA: Diagnosis not present

## 2020-08-01 DIAGNOSIS — R600 Localized edema: Secondary | ICD-10-CM | POA: Diagnosis not present

## 2020-08-04 DIAGNOSIS — S63286A Dislocation of proximal interphalangeal joint of right little finger, initial encounter: Secondary | ICD-10-CM | POA: Diagnosis not present

## 2020-09-15 DIAGNOSIS — Z1231 Encounter for screening mammogram for malignant neoplasm of breast: Secondary | ICD-10-CM | POA: Diagnosis not present

## 2020-09-15 DIAGNOSIS — K080 Exfoliation of teeth due to systemic causes: Secondary | ICD-10-CM | POA: Diagnosis not present

## 2020-10-11 DIAGNOSIS — N924 Excessive bleeding in the premenopausal period: Secondary | ICD-10-CM | POA: Diagnosis not present

## 2020-10-11 DIAGNOSIS — I10 Essential (primary) hypertension: Secondary | ICD-10-CM | POA: Diagnosis not present

## 2020-10-11 DIAGNOSIS — Z01419 Encounter for gynecological examination (general) (routine) without abnormal findings: Secondary | ICD-10-CM | POA: Diagnosis not present

## 2020-10-23 ENCOUNTER — Telehealth: Payer: Self-pay

## 2020-10-23 NOTE — Telephone Encounter (Signed)
Talked to patient about getting her on dr Pottsgrove's schedule for [facility] h[facility] clinic and she will call us back to schedule.

## 2020-10-25 DIAGNOSIS — N924 Excessive bleeding in the premenopausal period: Secondary | ICD-10-CM | POA: Diagnosis not present

## 2020-11-23 ENCOUNTER — Encounter: Payer: Self-pay | Admitting: Cardiovascular Disease

## 2020-11-23 ENCOUNTER — Ambulatory Visit (INDEPENDENT_AMBULATORY_CARE_PROVIDER_SITE_OTHER): Payer: Federal, State, Local not specified - PPO | Admitting: Cardiovascular Disease

## 2020-11-23 ENCOUNTER — Other Ambulatory Visit: Payer: Self-pay

## 2020-11-23 VITALS — BP 154/84 | HR 73 | Ht 67.0 in | Wt 321.0 lb

## 2020-11-23 DIAGNOSIS — I10 Essential (primary) hypertension: Secondary | ICD-10-CM | POA: Diagnosis not present

## 2020-11-23 DIAGNOSIS — R079 Chest pain, unspecified: Secondary | ICD-10-CM | POA: Diagnosis not present

## 2020-11-23 DIAGNOSIS — I209 Angina pectoris, unspecified: Secondary | ICD-10-CM | POA: Diagnosis not present

## 2020-11-23 HISTORY — DX: Morbid (severe) obesity due to excess calories: E66.01

## 2020-11-23 HISTORY — DX: Essential (primary) hypertension: I10

## 2020-11-23 HISTORY — DX: Angina pectoris, unspecified: I20.9

## 2020-11-23 MED ORDER — AMLODIPINE BESYLATE 5 MG PO TABS: 5.0000 mg | ORAL_TABLET | Freq: Every day | ORAL | 3 refills | Status: DC

## 2020-11-23 NOTE — Progress Notes (Signed)
Hypertension Clinic Initial Assessment:    Date:  11/23/2020   ID:  Susan Sutton, DOB [birth year], MRN [id number]  PCP:  Diamantina Providence, FNP  Cardiologist:  No primary care provider on file.  Nephrologist:  Referring MD: Lorie Phenix, MD   CC: Hypertension  History of Present Illness:    Susan Sutton is a 50 y.o. female with hypertension, morbid obesity, NSVT, and prediabetes who is being seen today for the evaluation of hypertension at the request of Lorie Phenix, MD.  She has been working form home due to the pandemic.  She has been much more sedentary.  She thinks that this is why her BP has been high. She saw Dr. Cherly Hensen on 09/2020 and her BP was 160/88.  She has gained a lot of weight since working from home.  She is no longer as active.  She does walk her dog twice per day for about 20 minutes.  Lately she has noted some chest tightness with exertion.  It lasts for a few moments at a time.  She also has exertional dyspnea.  She denies any lower extremity edema, orthopnea, or PND.  There is no associated nausea or diaphoresis.  Ms. Plake previously saw cardiologist through [facility] for palpitations.  She wore an ambulatory monitor that showed 4 runs of NSVT with rare PVCs and PACs.  She was started on metoprolol 09/2020.  She did not notice much change in her symptoms.  Despite this her blood pressure has continued to be elevated.  At home it has been running in the 150s to 170s over 80s to 90s.  She mostly cooks at home.  She is using air Eunice Blase and switch to [employer] because she heard this was better.  She drinks 1 to 212 ounce coffees daily.  She was drinking up to a gallon of sweet tea.  She is adding Trulicity to her coffee instead of sugar.  She is trying to slowly cut back on how much tea she drinks.  She does snore and has some daytime somnolence, though she attributes this to her job being boring.  She has chronic pain from sciatica and takes ibuprofen at least once or twice  daily for either her back pain or menstrual cramps.  She notes a strong family history of heart disease in the women in her family.  Previous antihypertensives: none   Past Medical History:  Diagnosis Date  . Angina pectoris (HCC) 11/23/2020  . Essential hypertension 11/23/2020  . Hepatitis    "b"  . Morbid obesity (HCC) 11/23/2020    Past Surgical History:  Procedure Laterality Date  . KNEE ARTHROSCOPY Right 08/22/2014   Procedure: ARTHROSCOPY KNEE;  Surgeon: Dannielle Huh, MD;  Location: [facility] OR;  Service: Orthopedics;  Laterality: Right;  Partial medial menisectomy    Current Medications: Current Meds  Medication Sig  . amLODipine (NORVASC) 5 MG tablet Take 1 tablet (5 mg total) by mouth daily.  Marland Kitchen ibuprofen (ADVIL,MOTRIN) 200 MG tablet Take 4 tablets (800 mg total) by mouth 3 (three) times daily.  . metoprolol succinate (TOPROL-XL) 50 MG 24 hr tablet Take 50 mg by mouth daily.     Allergies:   Patient has no known allergies.   Social History   Socioeconomic History  . Marital status: Single    Spouse name: Not on file  . Number of children: Not on file  . Years of education: Not on file  . Highest education level: Not on file  Occupational History  .  Not on file  Tobacco Use  . Smoking status: Never Smoker  . Smokeless tobacco: Never Used  Vaping Use  . Vaping Use: Never used  Substance and Sexual Activity  . Alcohol use: Yes    Comment: occ  . Drug use: No  . Sexual activity: Not on file  Other Topics Concern  . Not on file  Social History Narrative  . Not on file   Social Determinants of Health   Financial Resource Strain: Not on file  Food Insecurity: Not on file  Transportation Needs: Not on file  Physical Activity: Not on file  Stress: Not on file  Social Connections: Not on file     Family History: The patient's family history includes Alzheimer's disease in her maternal grandmother; Breast cancer in her paternal aunt and paternal grandmother;  Colon cancer in her paternal uncle; Colon polyps in her sister; Congestive Heart Failure in her father and mother; Diabetes in her father, maternal grandmother, mother, and sister; Epilepsy in her maternal grandmother; Heart attack in her father and mother; Hyperlipidemia in her sister; Hypertension in her brother, father, maternal grandmother, mother, and sister; Lupus in her sister; Stroke in her mother.  ROS:   Please see the history of present illness.    All other systems reviewed and are negative.  EKGs/Labs/Other Studies Reviewed:    EKG:  EKG is ordered today.  The ekg ordered today demonstrates sinus rhythm.  Rate 73 bpm.  Recent Labs: No results found for requested labs within last 8760 hours.   07/05/19: Total cholesterol 151, triglycerides 62, HDL 51, LDL 87  Recent Lipid Panel    Component Value Date/Time   CHOL 146 01/23/2015 0000   TRIG 61 01/23/2015 0000   HDL 50 01/23/2015 0000   LDLCALC 84 01/23/2015 0000    Physical Exam:    VS:  BP (!) 154/84 (BP Location: Left Arm, Patient Position: Sitting, Cuff Size: Large)   Pulse 73   Ht 5\' 7"  (1.702 m)   Wt (!) 321 lb (145.6 kg)   SpO2 99%   BMI 50.28 kg/m  , BMI Body mass index is 50.28 kg/m. GENERAL:  Well appearing HEENT: Pupils equal round and reactive, fundi not visualized, oral mucosa unremarkable NECK:  No jugular venous distention, waveform within normal limits, carotid upstroke brisk and symmetric, no bruits LUNGS:  Clear to auscultation bilaterally HEART:  RRR.  PMI not displaced or sustained,S1 and S2 within normal limits, no S3, no S4, no clicks, no rubs, no murmurs ABD:  Flat, positive bowel sounds normal in frequency in pitch, no bruits, no rebound, no guarding, no midline pulsatile mass, no hepatomegaly, no splenomegaly EXT:  2 plus pulses throughout, no edema, no cyanosis no clubbing SKIN:  No rashes no nodules NEURO:  Cranial nerves II through XII grossly intact, motor grossly intact  throughout PSYCH:  Cognitively intact, oriented to person place and time  ASSESSMENT:    1. Essential hypertension   2. Chest pain of uncertain etiology   3. Morbid obesity (HCC)   4. Angina pectoris (HCC)     PLAN:    # Essential hypertension:  Blood pressure is above goal.  We discussed the fact that it would likely take more than one medicine to get her to goal.  She has been resistant to medicines in the past.  She has several lifestyle changes that could help improve her blood pressure control.  She is going to work on increasing her exercise to 150 minutes  weekly.  She is also going to try and get an accountability partner to help her with this.  She is interested in the PREP exercise program through the [facility].  I have asked her to track her blood pressures twice daily and start amlodipine 5 mg daily.  She will continue with her metoprolol, though this is mostly for her NSVT and palpitations.  Could consider switching to carvedilol in the future.  Secondary Causes of Hypertension  Medications/Herbal: OCP, steroids, stimulants, antidepressants, weight loss medication, immune suppressants, NSAIDs, sympathomimetics, alcohol, caffeine, licorice, ginseng, St. John's wort, chemo  Sleep Apnea: Testing not indicated Renal artery stenosis: Testing not indicated.  Hyperaldosteronism: (testing not indicated)  Hyper/hypothyroidism: Check TSH Pheochromocytoma:(testing not indicated)  Cushing's syndrome: (testing not indicated)  Coarctation of the aorta: BP symmetric  # Chest pain:  Patient has exertional symptoms.  Unable to walk on treadmill 2/2 sciatica.  She has a strong family history of CAD.  Will get a coronary CT-A.  Disposition:    FU with MD/PharmD in 1 month    Medication Adjustments/Labs and Tests Ordered: Current medicines are reviewed at length with the patient today.  Concerns regarding medicines are outlined above.  Orders Placed This Encounter  Procedures  . CT CORONARY  MORPH W/CTA COR W/SCORE W/CA W/CM &/OR WO/CM  . CT CORONARY FRACTIONAL FLOW RESERVE DATA PREP  . CT CORONARY FRACTIONAL FLOW RESERVE FLUID ANALYSIS  . TSH  . Comprehensive metabolic panel  . EKG 12-Lead   Meds ordered this encounter  Medications  . amLODipine (NORVASC) 5 MG tablet    Sig: Take 1 tablet (5 mg total) by mouth daily.    Dispense:  90 tablet    Refill:  3     Signed, Chilton Si, MD  11/23/2020 10:49 AM    [facility]

## 2020-11-23 NOTE — Patient Instructions (Addendum)
Medication Instructions:  START AMLODIPINE 5 MG DAILY   TAKE 2 OF YOUR METOPROLOL MORNING OF CARDIAC CT    Labwork: TSH/CMET TODAY    Testing/Procedures: Your physician has requested that you have cardiac CT. Cardiac computed tomography (CT) is a painless test that uses an x-ray machine to take clear, detailed pictures of your heart. For further information please visit [email or web address]. Please follow instruction sheet as given.   Follow-Up: 12/28/2020 AT 3:30 PM WITH PHARM D    You will receive a phone call from the PREP exercise and nutrition program to schedule an initial assessment.  Special Instructions:   MONITOR YOUR BLOOD PRESSURE TWICE A DAY, LOG IN THE BOOK PROVIDED. BRING THE BOOK AND YOUR BLOOD PRESSURE MACHINE TO YOUR FOLLOW UP IN 1 MONTH   Your cardiac CT will be scheduled at one of the below locations:   [facility] [address] [place], [place] [address] [phone]  [place] [address] [place], [place] [address] [phone]  If scheduled at [facility], please arrive at the [facility] main entrance of [facility] 30 minutes prior to test start time. Proceed to the [facility] Radiology Department (first floor) to check-in and test prep.  If scheduled at [facility], please arrive 15 mins early for check-in and test prep.  Please follow these instructions carefully (unless otherwise directed):  Hold all erectile dysfunction medications at least 3 days (72 hrs) prior to test.  On the Night Before the Test: . Be sure to Drink plenty of water. . Do not consume any caffeinated/decaffeinated beverages or chocolate 12 hours prior to your test. . Do not take any antihistamines 12 hours prior to your test. . If the patient has contrast allergy: ? Patient will need a prescription for Prednisone and very clear instructions (as  follows): 1. Prednisone 50 mg - take 13 hours prior to test 2. Take another Prednisone 50 mg 7 hours prior to test 3. Take another Prednisone 50 mg 1 hour prior to test 4. Take Benadryl 50 mg 1 hour prior to test . Patient must complete all four doses of above prophylactic medications. . Patient will need a ride after test due to Benadryl.  On the Day of the Test: . Drink plenty of water. Do not drink any water within one hour of the test. . Do not eat any food 4 hours prior to the test. . You may take your regular medications prior to the test.  . Take metoprolol (Lopressor) two hours prior to test. . HOLD Furosemide/Hydrochlorothiazide morning of the test. . FEMALES- please wear underwire-free bra if available      After the Test: . Drink plenty of water. . After receiving IV contrast, you may experience a mild flushed feeling. This is normal. . On occasion, you may experience a mild rash up to 24 hours after the test. This is not dangerous. If this occurs, you can take Benadryl 25 mg and increase your fluid intake. . If you experience trouble breathing, this can be serious. If it is severe call 911 IMMEDIATELY. If it is mild, please call our office. . If you take any of these medications: Glipizide/Metformin, Avandament, Glucavance, please do not take 48 hours after completing test unless otherwise instructed.   Once we have confirmed authorization from your insurance company, we will call you to set up a date and time for your test. Based on how  quickly your insurance processes prior authorizations requests, please allow up to 4 weeks to be contacted for scheduling your Cardiac CT appointment. Be advised that routine Cardiac CT appointments could be scheduled as many as 8 weeks after your provider has ordered it.  For non-scheduling related questions, please contact the cardiac imaging nurse navigator should you have any questions/concerns: Marchia Bond, Cardiac Imaging Nurse  Navigator Burley Saver, Interim Cardiac Imaging Nurse [place] [facility] Direct Office Dial: [phone]   For scheduling needs, including cancellations and rescheduling, please call [place], [phone].   Cardiac CT Angiogram A cardiac CT angiogram is a procedure to look at the heart and the area around the heart. It may be done to help find the cause of chest pains or other symptoms of heart disease. During this procedure, a substance called contrast dye is injected into the blood vessels in the area to be checked. A large X-ray machine, called a CT scanner, then takes detailed pictures of the heart and the surrounding area. The procedure is also sometimes called a coronary CT angiogram, coronary artery scanning, or CTA. A cardiac CT angiogram allows the health care provider to see how well blood is flowing to and from the heart. The health care provider will be able to see if there are any problems, such as:  Blockage or narrowing of the coronary arteries in the heart.  Fluid around the heart.  Signs of weakness or disease in the muscles, valves, and tissues of the heart. Tell a health care provider about:  Any allergies you have. This is especially important if you have had a previous allergic reaction to contrast dye.  All medicines you are taking, including vitamins, herbs, eye drops, creams, and over-the-counter medicines.  Any blood disorders you have.  Any surgeries you have had.  Any medical conditions you have.  Whether you are pregnant or may be pregnant.  Any anxiety disorders, chronic pain, or other conditions you have that may increase your stress or prevent you from lying still. What are the risks? Generally, this is a safe procedure. However, problems may occur, including:  Bleeding.  Infection.  Allergic reactions to medicines or dyes.  Damage to other structures or organs.  Kidney damage from the contrast dye that is  used.  Increased risk of cancer from radiation exposure. This risk is low. Talk with your health care provider about: ? The risks and benefits of testing. ? How you can receive the lowest dose of radiation. What happens before the procedure?  Wear comfortable clothing and remove any jewelry, glasses, dentures, and hearing aids.  Follow instructions from your health care provider about eating and drinking. This may include: ? For 12 hours before the procedure - avoid caffeine. This includes tea, coffee, soda, energy drinks, and diet pills. Drink plenty of water or other fluids that do not have caffeine in them. Being well hydrated can prevent complications. ? For 4-6 hours before the procedure - stop eating and drinking. The contrast dye can cause nausea, but this is less likely if your stomach is empty.  Ask your health care provider about changing or stopping your regular medicines. This is especially important if you are taking diabetes medicines, blood thinners, or medicines to treat problems with erections (erectile dysfunction). What happens during the procedure?  Hair on your chest may need to be removed so that small sticky patches called electrodes can be placed on your chest. These will transmit information that helps to  monitor your heart during the procedure.  An IV will be inserted into one of your veins.  You might be given a medicine to control your heart rate during the procedure. This will help to ensure that good images are obtained.  You will be asked to lie on an exam table. This table will slide in and out of the CT machine during the procedure.  Contrast dye will be injected into the IV. You might feel warm, or you may get a metallic taste in your mouth.  You will be given a medicine called nitroglycerin. This will relax or dilate the arteries in your heart.  The table that you are lying on will move into the CT machine tunnel for the scan.  The person running the  machine will give you instructions while the scans are being done. You may be asked to: ? Keep your arms above your head. ? Hold your breath. ? Stay very still, even if the table is moving.  When the scanning is complete, you will be moved out of the machine.  The IV will be removed. The procedure may vary among health care providers and hospitals.   What can I expect after the procedure? After your procedure, it is common to have:  A metallic taste in your mouth from the contrast dye.  A feeling of warmth.  A headache from the nitroglycerin. Follow these instructions at home:  Take over-the-counter and prescription medicines only as told by your health care provider.  If you are told, drink enough fluid to keep your urine pale yellow. This will help to flush the contrast dye out of your body.  Most people can return to their normal activities right after the procedure. Ask your health care provider what activities are safe for you.  It is up to you to get the results of your procedure. Ask your health care provider, or the department that is doing the procedure, when your results will be ready.  Keep all follow-up visits as told by your health care provider. This is important. Contact a health care provider if:  You have any symptoms of allergy to the contrast dye. These include: ? Shortness of breath. ? Rash or hives. ? A racing heartbeat. Summary  A cardiac CT angiogram is a procedure to look at the heart and the area around the heart. It may be done to help find the cause of chest pains or other symptoms of heart disease.  During this procedure, a large X-ray machine, called a CT scanner, takes detailed pictures of the heart and the surrounding area after a contrast dye has been injected into blood vessels in the area.  Ask your health care provider about changing or stopping your regular medicines before the procedure. This is especially important if you are taking  diabetes medicines, blood thinners, or medicines to treat erectile dysfunction.  If you are told, drink enough fluid to keep your urine pale yellow. This will help to flush the contrast dye out of your body. This information is not intended to replace advice given to you by your health care provider. Make sure you discuss any questions you have with your health care provider. Document Revised: 06/16/2019 Document Reviewed: 06/16/2019 Elsevier Patient Education  2021 [employer].

## 2020-11-24 ENCOUNTER — Telehealth: Payer: Self-pay

## 2020-11-24 LAB — COMPREHENSIVE METABOLIC PANEL
ALT: 11 IU/L (ref 0–32)
AST: 16 IU/L (ref 0–40)
Albumin/Globulin Ratio: 1.4 (ref 1.2–2.2)
Albumin: 4 g/dL (ref 3.8–4.8)
Alkaline Phosphatase: 128 IU/L — ABNORMAL HIGH (ref 44–121)
BUN/Creatinine Ratio: 12 (ref 9–23)
BUN: 9 mg/dL (ref 6–24)
Bilirubin Total: 0.4 mg/dL (ref 0.0–1.2)
CO2: 23 mmol/L (ref 20–29)
Calcium: 8.8 mg/dL (ref 8.7–10.2)
Chloride: 104 mmol/L (ref 96–106)
Creatinine, Ser: 0.75 mg/dL (ref 0.57–1.00)
GFR calc Af Amer: 109 mL/min/{1.73_m2} (ref >59)
GFR calc non Af Amer: 95 mL/min/{1.73_m2} (ref >59)
Globulin, Total: 2.9 g/dL (ref 1.5–4.5)
Glucose: 97 mg/dL (ref 65–99)
Potassium: 4.5 mmol/L (ref 3.5–5.2)
Sodium: 139 mmol/L (ref 134–144)
Total Protein: 6.9 g/dL (ref 6.0–8.5)

## 2020-11-24 LAB — TSH: TSH: 2.11 u[IU]/mL (ref 0.450–4.500)

## 2020-11-24 NOTE — Telephone Encounter (Signed)
Call to pt reference referral to PREP Explained program. Interested in participating.  Needs evening class, Judie Grieve location acceptable Explained next class would tentatively start on 2/15 at [facility] T/TH 615p-730p Will call her closer to start to do intake assessment and firm up start date.

## 2020-11-27 ENCOUNTER — Telehealth: Payer: Self-pay | Admitting: Cardiovascular Disease

## 2020-11-27 NOTE — Telephone Encounter (Signed)
Attempted to call patient, left message for patient to call back to office.   

## 2020-11-27 NOTE — Telephone Encounter (Signed)
Susan Sutton is calling requesting to speak with a nurse in regards to something she has been monitoring that is causing her concern. Please advise.

## 2020-11-28 DIAGNOSIS — N924 Excessive bleeding in the premenopausal period: Secondary | ICD-10-CM | POA: Diagnosis not present

## 2020-11-28 DIAGNOSIS — N939 Abnormal uterine and vaginal bleeding, unspecified: Secondary | ICD-10-CM | POA: Diagnosis not present

## 2020-11-28 DIAGNOSIS — R9389 Abnormal findings on diagnostic imaging of other specified body structures: Secondary | ICD-10-CM | POA: Diagnosis not present

## 2020-12-01 NOTE — Telephone Encounter (Signed)
I called and spoke with the patient. I advised her I was calling to follow up on her call from earlier this week as the nurse was unable to reach her at that time.  Per the patient, what she was calling about on Monday (1/24) has since subsided.  She advised Sunday night & Monday morning she was having some left sided chest pain that was a little worse with movement, but her BP/ pulse monitor advised she was having an "irregular" heart beat.  She states she does have a family history of heart disease and she just called out of concern.  The patient advised as well, that her menstrual cycles have also been irregular- she is following with her OB-GYN for this, but she has since started her cycle this week.  I have advised the patient that the change in her hormones may have been contributing to the symptoms she was having, but I am more reassured if her chest pain was worse with change in position that is does not sound cardiac in nature.  I advised her I am unsure if the "irregular" heartbeat may have been PAC's/ PVC's in relation to her menstrual cycle approaching/ increased stress/ caffeine. I have explained to her this could be a possibility but is a benign finding if that is the case. However, some people do report associated chest pain with these beats at times.  Dr. Duke Salvia has ordered a Cardiac CTA to be done.  I have advised the patient that I feel her symptoms earlier in the week are most likely benign, but with her family history, to please proceed with the Cardiac CTA as advised.   The patient voices understanding and was appreciative for the call back.

## 2020-12-07 ENCOUNTER — Other Ambulatory Visit: Payer: Self-pay | Admitting: Obstetrics and Gynecology

## 2020-12-11 ENCOUNTER — Telehealth: Payer: Self-pay | Admitting: Cardiovascular Disease

## 2020-12-11 NOTE — Telephone Encounter (Signed)
Pt c/o medication issue:  1. Name of Medication: amLODipine (NORVASC) 5 MG tablet  2. How are you currently taking this medication (dosage and times per day)? 1 tablet daily  3. Are you having a reaction (difficulty breathing--STAT)? no  4. What is your medication issue? Patient states she has been having swelling and headaches since being on the medication. She states the swelling is in her ankles and legs and has noticed it the past week, but is not sure if she has gained weight. She states she also wakes up with a headache everyday and takes medication at night.

## 2020-12-11 NOTE — Telephone Encounter (Signed)
Spoke with patient. She began amlodipine on 11/29/20. Over the last week noticed ankle / leg swelling. Indentation noticed around socks. Noticed ankles felt tight and has begun elevating legs at night as she is on her feet all day. Mild reduction in swelling. Unaware of weight gain as she does not have a scale.   Bps averaging per patient: 139/85 150/91 140/85, 148/88 AM 153/85 PM  Will route to MD for review.

## 2020-12-12 ENCOUNTER — Telehealth (HOSPITAL_COMMUNITY): Payer: Self-pay | Admitting: *Deleted

## 2020-12-12 NOTE — Progress Notes (Signed)
Spoke with Susan Sutton at dr cousins office and made aware pt bmi is 50.28 and pt is not candidate for wlsc per anesthesia guidelines, also pt has cardiac cta scheduled for 2-07-13-2021 at 445 pm.

## 2020-12-12 NOTE — Telephone Encounter (Signed)
Reaching out to patient to offer assistance regarding upcoming cardiac imaging study; pt verbalizes understanding of appt date/time, but requesting to reschedule.  Pt states she is having a surgical procedure this week and wanted to delay scan.  Pt rescheduled for February 16 at 9:15. Pt made aware we would give another call to go over instructions.  Larey Brick RN Navigator Cardiac Imaging [facility] [phone] office [phone] cell

## 2020-12-13 ENCOUNTER — Ambulatory Visit (HOSPITAL_COMMUNITY): Admission: RE | Admit: 2020-12-13 | Payer: Federal, State, Local not specified - PPO | Source: Ambulatory Visit

## 2020-12-14 ENCOUNTER — Encounter (HOSPITAL_COMMUNITY): Payer: Self-pay | Admitting: Vascular Surgery

## 2020-12-14 ENCOUNTER — Other Ambulatory Visit (HOSPITAL_COMMUNITY)
Admission: RE | Admit: 2020-12-14 | Discharge: 2020-12-14 | Disposition: A | Discharge status: Home / Self Care | Payer: Federal, State, Local not specified - PPO | Source: Ambulatory Visit | Attending: Obstetrics and Gynecology | Admitting: Obstetrics and Gynecology

## 2020-12-14 ENCOUNTER — Telehealth: Payer: Self-pay

## 2020-12-14 ENCOUNTER — Telehealth: Payer: Self-pay | Admitting: Cardiovascular Disease

## 2020-12-14 DIAGNOSIS — Z01812 Encounter for preprocedural laboratory examination: Secondary | ICD-10-CM | POA: Insufficient documentation

## 2020-12-14 DIAGNOSIS — Z20822 Contact with and (suspected) exposure to covid-19: Secondary | ICD-10-CM | POA: Diagnosis not present

## 2020-12-14 LAB — SARS CORONAVIRUS 2 (TAT 6-24 HRS): SARS Coronavirus 2: NEGATIVE

## 2020-12-14 NOTE — Telephone Encounter (Signed)
Call placed to patient reference start of new PREP class at [facility] 12/26/20 at 615p-730pm T/TH Can do that schedule and times.  Intake appt scheduled for 2/17 at 7pm

## 2020-12-14 NOTE — Telephone Encounter (Signed)
Just s/w Dr. Cherly Hensen in regards to the urgency of procedure set for tomorrow with her. Per Dr. Cherly Hensen pt changed her appt for her CT with [facility]. CT was originally set for 12/13/20, now set for 12/20/20. Per Dr. Cherly Hensen, she is fine if the procedure needs to be postponed if cardiology feels the CT needs to be done before proceeding. I assured Dr. Cherly Hensen that I will send my note back to the pre op provider Ronie Spies, [facility] today. If any further questions ok to reach out to Dr. Cherly Hensen.

## 2020-12-14 NOTE — Telephone Encounter (Signed)
   [facility] Pre-operative Risk Assessment    HEARTCARE STAFF: - Please ensure there is not already an duplicate clearance open for this procedure. - Under Visit Info/Reason for Call, type in Other and utilize the format Clearance MM/DD/YY or Clearance TBD. Do not use dashes or single digits. - If request is for dental extraction, please clarify the # of teeth to be extracted.  Request for surgical clearance:  1. What type of surgery is being performed? D&C HYSTEROSCOPY WITH RESECTOSCOPE  2. When is this surgery scheduled? 12/15/20   3. What type of clearance is required (medical clearance vs. Pharmacy clearance to hold med vs. Both)? MEDICAL  4. Are there any medications that need to be held prior to surgery and how long? NONE LISTED   5. Practice name and name of physician performing surgery? DR. Alanda Slim [facility]   34. What is the office phone number? [phone]   7.   What is the office fax number? [phone]  8.   Anesthesia type (None, local, MAC, general) ? GENERAL   Julaine Hua 12/14/2020, 2:15 PM  _________________________________________________________________   (provider comments below)

## 2020-12-14 NOTE — Progress Notes (Signed)
Susan Sutton was seen by Dr Duke Salvia with chest pain and elevated blood pressure on 11/23/20.  A cardiac CT was ordered and was scheduled for 2/9.  Susan Sutton asked to have it changed to another date, since she is having surgery this week.  CT is  now scheduled for 12/20/20. I asked anesthesia PA-C to review.

## 2020-12-14 NOTE — Telephone Encounter (Signed)
Since surgery is not felt urgent, given the recent symptoms of exertional chest pain with plan for ischemic testing (and need for general anesthesia), it seems prudent to proceed with CT testing before clearing for surgery.   Per our pre-op protocol, patient needs a follow-up appointment to reassess test results and ensure stability of symptoms before clearing for surgery.  Can we arrange for this patient to have a virtual visit appointment a few days after her CT to review final clearance decision? Can be with APP. Thank you!   Will also cc to Dr. Duke Salvia so she is aware of the plan for close follow-up to finalize surgical clearance for when then coronary CT result comes back.

## 2020-12-14 NOTE — Telephone Encounter (Signed)
New Message:      Please call Susan Sutton, concerning this pt. Pt is scheduled for surgery tomorrow.

## 2020-12-14 NOTE — Telephone Encounter (Signed)
Dr. Purnell Shoemaker Office returning a call to [employer]

## 2020-12-14 NOTE — Telephone Encounter (Signed)
Left message for Karma Lew, CMA for Dr. Cherly Hensen, to please call our office in regards to if procedure is of emergent nature.

## 2020-12-14 NOTE — Telephone Encounter (Signed)
appt with Corine Shelter, [facility] 12/25/20 for pre op clearance.

## 2020-12-14 NOTE — Progress Notes (Signed)
Anesthesia Chart Review: SAME DAY WORK-UP   Case: [id number] Date/Time: 12/15/20 1312   Procedure: DILATATION & CURETTAGE/HYSTEROSCOPY WITH RESECTOCOPE (N/A )   Anesthesia type: General   Pre-op diagnosis: DUB, endometrial polyp, endometrial thickening   [facility]: [facility] ROOM 09 / [facility]   Surgeons: Maxie Better, MD      DISCUSSION: Patient is a 50 year old female scheduled for the above procedure. Case was moved from [facility] because her BMI was > 50. Patient also had a cardiac CTA that was scheduled for 12/13/20, but due to surgery she postponed it until 12/20/20.   History includes never smoker, hepatitis "B", HTN, chest pain (~ 11/2020), morbid obesity.   Dr. Cherly Hensen referred patient to cardiologist Dr. Presley Raddle for HTN (160/88). Patient had gained weight since working from home. Reported walking her dog twice a day for about 20 minutes but had "noted some chest tightness with exertion." Given symptoms and family history of CAD, a coronary CTA was ordered. Patient was also asked increase her exercise, start amlodipine, monitor home BP, and attempt to get an accountability partner. Of note, she had previously seen cardiologist Dr. Chales Abrahams in [facility] in 2020-2021 for palpitations. 30 day event monitor showed rare PVCs, PACs, and 4 runs of NSVT. Echo showed presevered LVF and good valve function. Symptoms controlled on b-blocker therapy.    Currently, I do not see a recent CBC. CMET on 11/23/20 showed Cr 0.75, glucose 97, AST 16, ALT 11. I spoke with Dr. Cherly Hensen who said patient has been having vaginal bleeding for months which is why surgery is indicated. Patient was under the impression that her coronary CTA was not urgent, which is why she rescheduled it. Discussed with Dr. Cherly Hensen and will attempt to confirm with cardiologist if coronary CTA is indicated prior to [facility]. Dr. Duke Salvia is out of the office on 12/14/20, but reviewed by Ronie Spies, PA-C. If case is not felt urgent then it was recommended that  patient have coronary CT followed by virtual visit before clearing for surgery.  VS:   Wt Readings from Last 3 Encounters:  11/23/20 (!) 145.6 kg  06/16/20 (!) 139.7 kg  01/05/20 (!) 138.3 kg   BP Readings from Last 3 Encounters:  11/23/20 (!) 154/84  06/17/20 132/68  01/05/20 (!) 190/105   Pulse Readings from Last 3 Encounters:  11/23/20 73  06/17/20 78  01/05/20 63    PROVIDERS: Diamantina Providence, FNP is PCP  Chilton Si, MD is cardiologist. Previously she saw Dyane Dustman, MD in 2020/2021 for palpitations (see [facility] Everywhere).    LABS: For day of procedure.   EKG: 11/23/20: NSR   CV: 30 day Event monitor 06/24/19 ([facility]): Impression: Normal sinus rhythm predominates  Rare PVCs, PACs  4 runs of nonsustained ventricular tachycardia are noted, relatively slow  No significant bradycardia  According to 07/23/19 note by Dr. Chales Abrahams ([facility]): 05/20/19 echo showed: "Her echocardiogram demonstrated preserved left ventricular function and good valve function."   Past Medical History:  Diagnosis Date  . Angina pectoris (HCC) 11/23/2020  . Essential hypertension 11/23/2020  . Hepatitis    "b"  . Morbid obesity (HCC) 11/23/2020    Past Surgical History:  Procedure Laterality Date  . KNEE ARTHROSCOPY Right 08/22/2014   Procedure: ARTHROSCOPY KNEE;  Surgeon: Dannielle Huh, MD;  Location: [facility] OR;  Service: Orthopedics;  Laterality: Right;  Partial medial menisectomy    MEDICATIONS: No current facility-administered medications for this encounter.   Marland Kitchen amLODipine (  NORVASC) 5 MG tablet  . ibuprofen (ADVIL,MOTRIN) 200 MG tablet  . metoprolol succinate (TOPROL-XL) 50 MG 24 hr tablet    Shonna Chock, PA-C Surgical Short Stay/Anesthesiology [facility] Phone [phone] [facility] Phone [phone] 12/14/2020 3:53 PM

## 2020-12-14 NOTE — Telephone Encounter (Signed)
Will send message to scheduler to help out in making appt. Notes will be sent to Dr. Cherly Hensen.

## 2020-12-14 NOTE — Telephone Encounter (Signed)
Please call to get more details. No other clearance requested in chart that I can see. She was recently seen in the office with chest pain and coronary CTA pending which will need to be completed and reviewed by MD prior to cardiac clearance unless emergent surgery.

## 2020-12-15 ENCOUNTER — Ambulatory Visit (HOSPITAL_COMMUNITY)
Admission: RE | Admit: 2020-12-15 | Payer: Federal, State, Local not specified - PPO | Source: Home / Self Care | Admitting: Obstetrics and Gynecology

## 2020-12-15 SURGERY — DILATATION & CURETTAGE/HYSTEROSCOPY WITH RESECTOCOPE
Anesthesia: General

## 2020-12-18 ENCOUNTER — Telehealth (HOSPITAL_COMMUNITY): Payer: Self-pay | Admitting: *Deleted

## 2020-12-18 NOTE — Telephone Encounter (Signed)
Reaching out to patient to offer assistance regarding upcoming cardiac imaging study; pt verbalizes understanding of appt date/time, parking situation and where to check in, pre-test NPO status and medications ordered, and verified current allergies; name and call back number provided for further questions should they arise  Gitel Beste RN Navigator Cardiac Imaging [facility] [phone] office [phone] cell  

## 2020-12-18 NOTE — Telephone Encounter (Signed)
Spoke with patient and the swelling in her feet and ankles subsided about 5 days ago  Blood pressure coming down, today 132/88 Patient wanted to wait until after CT scan before making any changes  Advised ok to continue current regimen for now and will forward to Dr Duke Salvia so she will be aware

## 2020-12-18 NOTE — Telephone Encounter (Signed)
OK.  Stop amlodipine and start HCTZ 12.5mg  instead. Check BMP in a week.

## 2020-12-19 NOTE — Telephone Encounter (Signed)
OK sounds good. Thanks

## 2020-12-20 ENCOUNTER — Other Ambulatory Visit: Payer: Self-pay

## 2020-12-20 ENCOUNTER — Ambulatory Visit (HOSPITAL_COMMUNITY): Admission: RE | Admit: 2020-12-20 | Payer: Federal, State, Local not specified - PPO | Source: Ambulatory Visit

## 2020-12-20 ENCOUNTER — Ambulatory Visit (HOSPITAL_COMMUNITY)
Admission: RE | Admit: 2020-12-20 | Discharge: 2020-12-20 | Disposition: A | Discharge status: Home / Self Care | Payer: Federal, State, Local not specified - PPO | Source: Ambulatory Visit | Attending: Cardiovascular Disease | Admitting: Cardiovascular Disease

## 2020-12-20 ENCOUNTER — Encounter (HOSPITAL_COMMUNITY): Payer: Self-pay

## 2020-12-20 ENCOUNTER — Encounter: Payer: Federal, State, Local not specified - PPO | Admitting: *Deleted

## 2020-12-20 DIAGNOSIS — I10 Essential (primary) hypertension: Secondary | ICD-10-CM | POA: Insufficient documentation

## 2020-12-20 DIAGNOSIS — Z006 Encounter for examination for normal comparison and control in clinical research program: Secondary | ICD-10-CM

## 2020-12-20 DIAGNOSIS — R079 Chest pain, unspecified: Secondary | ICD-10-CM | POA: Diagnosis not present

## 2020-12-20 MED ORDER — IOHEXOL 350 MG/ML SOLN
100.0000 mL | Freq: Once | INTRAVENOUS | Status: AC | PRN
  Administered 2020-12-20: 100 mL via INTRAVENOUS

## 2020-12-20 MED ORDER — NITROGLYCERIN 0.4 MG SL SUBL
SUBLINGUAL_TABLET | SUBLINGUAL | Status: AC
  Filled 2020-12-20: qty 2

## 2020-12-20 MED ORDER — NITROGLYCERIN 0.4 MG SL SUBL
0.8000 mg | SUBLINGUAL_TABLET | Freq: Once | SUBLINGUAL | Status: AC
  Administered 2020-12-20: 0.8 mg via SUBLINGUAL

## 2020-12-20 NOTE — Research (Signed)
Subject Name: [name]  Subject met inclusion and exclusion criteria.  The informed consent form, study requirements and expectations were reviewed with the subject and questions and concerns were addressed prior to the signing of the consent form.  The subject verbalized understanding of the trial requirements.  The subject agreed to participate in the Identify trial and signed the informed consent at 0748 on 12/20/20  The informed consent was obtained prior to performance of any protocol-specific procedures for the subject.  A copy of the signed informed consent was given to the subject and a copy was placed in the subject's medical record.   Susan Sutton

## 2020-12-25 ENCOUNTER — Encounter: Payer: Self-pay | Admitting: Cardiology

## 2020-12-25 ENCOUNTER — Telehealth (INDEPENDENT_AMBULATORY_CARE_PROVIDER_SITE_OTHER): Payer: Federal, State, Local not specified - PPO | Admitting: Cardiology

## 2020-12-25 DIAGNOSIS — R079 Chest pain, unspecified: Secondary | ICD-10-CM

## 2020-12-25 DIAGNOSIS — I10 Essential (primary) hypertension: Secondary | ICD-10-CM | POA: Diagnosis not present

## 2020-12-25 DIAGNOSIS — Z01818 Encounter for other preprocedural examination: Secondary | ICD-10-CM | POA: Diagnosis not present

## 2020-12-25 NOTE — Progress Notes (Signed)
Virtual Visit via Telephone Note   This visit type was conducted due to national recommendations for restrictions regarding the COVID-19 Pandemic (e.g. social distancing) in an effort to limit this patient's exposure and mitigate transmission in our community.  Due to her co-morbid illnesses, this patient is at least at moderate risk for complications without adequate follow up.  This format is felt to be most appropriate for this patient at this time.  The patient did not have access to video technology/had technical difficulties with video requiring transitioning to audio format only (telephone).  All issues noted in this document were discussed and addressed.  No physical exam could be performed with this format.  Please refer to the patient's chart for her  consent to telehealth for [facility].    Date:  12/25/2020   ID:  Susan Sutton, DOB May 12, 1971, MRN [id number] The patient was identified using 2 identifiers.  Patient Location: Home Provider Location: Home Office   PCP:  Diamantina Providence, FNP   [facility]  Cardiologist:  Dr Duke Salvia Advanced Practice Provider:  No care team member to display Electrophysiologist:  None   Evaluation Performed:  Follow-Up Visit  Chief Complaint:  none  History of Present Illness:    Susan Sutton is a pleasant 50 y.o. female with with a history of morbid obesity and a family history of coronary disease who was referred to Dr. Duke Salvia in January 2022 for hypertension and chest pain.  At that visit amlodipine was added and she was set up for a coronary CTA.  This revealed no coronary disease and a calcium score of 0.  Initially the patient had complained of some swelling on the amlodipine 5 mg.  It was suggested that she stop this and start HCTZ 12.5 mg.  The patient tells me today that she never did this and she remains on the amlodipine 5 mg, she says the swelling went away after couple days.  Her blood pressures have  been running in the 1 30-1 50 systolic range with diastolics of 80-85.  In addition she needs to be cleared for a D&C which was delayed awaiting her coronary CT results.  The patient does not have symptoms concerning for COVID-19 infection (fever, chills, cough, or new shortness of breath).    Past Medical History:  Diagnosis Date  . Angina pectoris (HCC) 11/23/2020  . Essential hypertension 11/23/2020  . Hepatitis    "b"  . Morbid obesity (HCC) 11/23/2020   Past Surgical History:  Procedure Laterality Date  . KNEE ARTHROSCOPY Right 08/22/2014   Procedure: ARTHROSCOPY KNEE;  Surgeon: Dannielle Huh, MD;  Location: [facility] OR;  Service: Orthopedics;  Laterality: Right;  Partial medial menisectomy     Current Meds  Medication Sig  . amLODipine (NORVASC) 5 MG tablet Take 1 tablet (5 mg total) by mouth daily.  Marland Kitchen ibuprofen (ADVIL,MOTRIN) 200 MG tablet Take 4 tablets (800 mg total) by mouth 3 (three) times daily. (Patient taking differently: Take 600-800 mg by mouth daily as needed for mild pain or moderate pain.)  . metoprolol succinate (TOPROL-XL) 50 MG 24 hr tablet Take 50 mg by mouth daily.     Allergies:   Amlodipine   Social History   Tobacco Use  . Smoking status: Never Smoker  . Smokeless tobacco: Never Used  Vaping Use  . Vaping Use: Never used  Substance Use Topics  . Alcohol use: Yes    Comment: occ  . Drug use: No  Family Hx: The patient's family history includes Alzheimer's disease in her maternal grandmother; Breast cancer in her paternal aunt and paternal grandmother; Colon cancer in her paternal uncle; Colon polyps in her sister; Congestive Heart Failure in her father and mother; Diabetes in her father, maternal grandmother, mother, and sister; Epilepsy in her maternal grandmother; Heart attack in her father and mother; Hyperlipidemia in her sister; Hypertension in her brother, father, maternal grandmother, mother, and sister; Lupus in her sister; Stroke in her  mother.  ROS:   Please see the history of present illness.    All other systems reviewed and are negative.   Prior CV studies:   The following studies were reviewed today:  Coronary CTA 12/20/2020  Labs/Other Tests and Data Reviewed:    EKG:  An ECG dated 11/23/2020 was personally reviewed today and demonstrated:  NSR, HR 73  Recent Labs: 11/23/2020: ALT 11; BUN 9; Creatinine, Ser 0.75; Potassium 4.5; Sodium 139; TSH 2.110   Recent Lipid Panel Lab Results  Component Value Date/Time   CHOL 146 01/23/2015 12:00 AM   TRIG 61 01/23/2015 12:00 AM   HDL 50 01/23/2015 12:00 AM   LDLCALC 84 01/23/2015 12:00 AM    Wt Readings from Last 3 Encounters:  12/25/20 (!) 315 lb (142.9 kg)  11/23/20 (!) 321 lb (145.6 kg)  06/16/20 (!) 308 lb (139.7 kg)     Risk Assessment/Calculations:      Objective:    Vital Signs:  BP (!) 141/92   Pulse 68   Ht 5\' 7"  (1.702 m)   Wt (!) 315 lb (142.9 kg)   BMI 49.34 kg/m    VITAL SIGNS:  reviewed  ASSESSMENT & PLAN:    HTN-still a little above goal, consider adding HCTZ 12.5 in addition to Toprol 50 mg and Norvasc 5 mg.  She has a f/u with the pharmacist in the HTN clinic- will defer to them.  Pre op clearance- I'll fax clearance to operating surgeon.   Morbid obesity- BMI 50- she is working on weight loss  C/P- Non cardiac, coronary CTA negative, Ca++ score 0        COVID-19 Education: The signs and symptoms of COVID-19 were discussed with the patient and how to seek care for testing (follow up with PCP or arrange E-visit).  The importance of social distancing was discussed today.  Time:   Today, I have spent 15 minutes with the patient with telehealth technology discussing the above problems.     Medication Adjustments/Labs and Tests Ordered: Current medicines are reviewed at length with the patient today.  Concerns regarding medicines are outlined above.   Tests Ordered: No orders of the defined types were placed in this  encounter.   Medication Changes: No orders of the defined types were placed in this encounter.   Follow Up:  In Person as scheduled  Signed, , Corine Shelter  12/25/2020 8:29 AM    [facility]

## 2020-12-25 NOTE — Patient Instructions (Addendum)
Medication Instructions:  Continue current medications *If you need a refill on your cardiac medications before your next appointment, please call your pharmacy*   Lab Work: None Ordered  Testing/Procedures: None ordered   Follow-Up: At [facility], you and your health needs are our priority.  As part of our continuing mission to provide you with exceptional heart care, we have created designated Provider Care Teams.  These Care Teams include your primary Cardiologist (physician) and Advanced Practice Providers (APPs -  Physician Assistants and Nurse Practitioners) who all work together to provide you with the care you need, when you need it.  We recommend signing up for the patient portal called "MyChart".  Sign up information is provided on this After Visit Summary.  MyChart is used to connect with patients for Virtual Visits (Telemedicine).  Patients are able to view lab/test results, encounter notes, upcoming appointments, etc.  Non-urgent messages can be sent to your provider as well.   To learn more about what you can do with MyChart, go to [email or web address].    Your next appointment:   Keep follow up appointment with pharmacist on February 24th

## 2020-12-25 NOTE — Telephone Encounter (Signed)
   Primary Cardiologist: Dr Duke Salvia  Chart reviewed and patient contacted today by phone as part of pre-operative protocol coverage. Given past medical history and time since last visit, based on ACC/AHA guidelines, Leshea Jaggers would be at acceptable risk for the planned procedure without further cardiovascular testing.   The patient was advised that if she develops new symptoms prior to surgery to contact our office to arrange for a follow-up visit, and she verbalized understanding.  I will route this recommendation to the requesting party via Epic fax function and remove from pre-op pool.  Please call with questions.  Corine Shelter, PA-C 12/25/2020, 8:39 AM

## 2020-12-26 ENCOUNTER — Telehealth: Payer: Self-pay | Admitting: Cardiovascular Disease

## 2020-12-26 NOTE — Telephone Encounter (Signed)
Susan Sutton is calling wanting to know if her upcoming pharmacist appointment can be made virtual due to work. Please advise.

## 2020-12-26 NOTE — Telephone Encounter (Signed)
We can not change adv HTN visit to virtual. Okay to change to another day is needed.

## 2020-12-28 ENCOUNTER — Other Ambulatory Visit: Payer: Self-pay

## 2020-12-28 ENCOUNTER — Ambulatory Visit (INDEPENDENT_AMBULATORY_CARE_PROVIDER_SITE_OTHER): Payer: Federal, State, Local not specified - PPO | Admitting: Pharmacist Clinician (PhC)/ Clinical Pharmacy Specialist

## 2020-12-28 VITALS — BP 134/82 | HR 81 | Ht 67.0 in | Wt 322.0 lb

## 2020-12-28 DIAGNOSIS — I10 Essential (primary) hypertension: Secondary | ICD-10-CM

## 2020-12-28 MED ORDER — CHLORTHALIDONE 25 MG PO TABS: 12.5000 mg | ORAL_TABLET | Freq: Every day | ORAL | 3 refills | Status: DC

## 2020-12-28 NOTE — Patient Instructions (Signed)
Return for a a follow up appointment March 24 at 3:30 pm  Go to the lab in 2 weeks to check kidney function  Check your blood pressure at home daily and keep record of the readings.  Take your BP meds as follows:  Start chlorthalidone 12.5 mg (1/2 tablet) each morning  Cut metoprolol succ 50 mg tablet in half and take 25 mg once daily  If you have any questions or concerns, please call Debrina Kizer/Raquel at [phone]    Bring all of your meds, your BP cuff and your record of home blood pressures to your next appointment.  Exercise as you're able, try to walk approximately 30 minutes per day.  Keep salt intake to a minimum, especially watch canned and prepared boxed foods.  Eat more fresh fruits and vegetables and fewer canned items.  Avoid eating in fast food restaurants.    HOW TO TAKE YOUR BLOOD PRESSURE: . Rest 5 minutes before taking your blood pressure. .  Don't smoke or drink caffeinated beverages for at least 30 minutes before. . Take your blood pressure before (not after) you eat. . Sit comfortably with your back supported and both feet on the floor (don't cross your legs). . Elevate your arm to heart level on a table or a desk. . Use the proper sized cuff. It should fit smoothly and snugly around your bare upper arm. There should be enough room to slip a fingertip under the cuff. The bottom edge of the cuff should be 1 inch above the crease of the elbow. . Ideally, take 3 measurements at one sitting and record the average.

## 2020-12-28 NOTE — Progress Notes (Signed)
12/29/2020 Susan Sutton 09/23/1971 [id number]   HPI:  Susan Sutton is a 50 y.o. female patient of Dr Duke Salvia, who presents today for advanced hypertension clinic follow up.   In addition to hypertension, her medical history is significant for morbid obesity, pre-diabetes and NSVT.  She was started on metoprolol in November, but notes not much change in her symptoms.  Her BP also continued to be elevated at 150-170's/80-90's.   She admits to using ibuprofen daily for chronic pain from sciatica.  Dr. Duke Salvia started her on amlodipine 5 mg daily and encouraged lifestyle modifications and was given information on PREP.    She returns today for follow up.  She had her first PREP class on Tuesday evening and is headed back tonight.  She is looking forward to this.  She notes that she developed some lower extremity edema after starting the amlodipine, but it has since gone away.  Otherwise no issues with the medication.  Also states that she does have occasional palpitations/flutters despite being on metoprolol, usually associated with her menstrual cycle.  She was scheduled to have a D&C earlier this month, but her employer refused to give her the day off.  She is currently trying to reschedule, but running into roadblocks with employer denying suggested dates.       Blood Pressure Goal:  130/80  Current Medications: amlodipine 5 mg qd, metoprolol succ 50 mg qd  Family Hx: mother's family has strong history of heart disease - mother died at 22 from MI, also had DM, amputation; maternal grandmother and aunts with cardiac issues, maternal cousin died from MI at 32, another has AF; father had hypertension, DM, died from drug abuse issues, more cancers in his family.     Social Hx: no tobacco, rare alcohol, coffee 12-14oz daily, trying to cut back, drinking less sweet tea (now 1 gal twice weekly instead of most days)  Diet: some eating out but not much, has recently switched to using salt while cooking  as opposed to at the table,  not as much veggies as would like - she is single, hard to cook vegetables for 1,   Exercise: started PREP Tuesday with information, next class tonight  Home BP readings: no readings in office lowest 132/83 approx, highest 158/94  Intolerances: previously had edema with amlodipine, tolerating well now  Labs: 1/22: Na 139, K 4.5, Glu 97, BUN 9, SCr 0.75, GFR 109   Wt Readings from Last 3 Encounters:  12/28/20 (!) 322 lb (146.1 kg)  12/25/20 (!) 315 lb (142.9 kg)  11/23/20 (!) 321 lb (145.6 kg)   BP Readings from Last 3 Encounters:  12/28/20 134/82  12/25/20 (!) 141/92  12/20/20 134/77   Pulse Readings from Last 3 Encounters:  12/28/20 81  12/25/20 68  12/20/20 75    Current Outpatient Medications  Medication Sig Dispense Refill  . amLODipine (NORVASC) 5 MG tablet Take 1 tablet (5 mg total) by mouth daily. 90 tablet 3  . chlorthalidone (HYGROTON) 25 MG tablet Take 0.5 tablets (12.5 mg total) by mouth daily. 15 tablet 3  . ibuprofen (ADVIL,MOTRIN) 200 MG tablet Take 4 tablets (800 mg total) by mouth 3 (three) times daily. (Patient taking differently: Take 600-800 mg by mouth daily as needed for mild pain or moderate pain.) 90 tablet 1  . metoprolol succinate (TOPROL-XL) 50 MG 24 hr tablet Take 0.5 tablets (25 mg total) by mouth daily. Take with or immediately following a meal. 90 tablet 3  No current facility-administered medications for this visit.    Allergies  Allergen Reactions  . Amlodipine Swelling    LE edema on 5mg  Amlodipine    Past Medical History:  Diagnosis Date  . Angina pectoris (HCC) 11/23/2020  . Essential hypertension 11/23/2020  . Hepatitis    "b"  . Morbid obesity (HCC) 11/23/2020    Blood pressure 134/82, pulse 81, height 5\' 7"  (1.702 m), weight (!) 322 lb (146.1 kg), last menstrual period 11/27/2020, SpO2 99 %.  Essential hypertension Patient with essential hypertension, currently not at goal on amlodipine and  metoprolol.  She was originally prescribed the metoprolol for palpitations, which seem to be mostly under control.  Because this is not a strong BP medication, we are going to try reducing the dose to 25 mg once daily and adding a low dose of chlorthalidone (12.5 mg) once daily.  This will hopefully allow her to get the diastolic pressure down further.  She should continue monitoring home BP readings and we will bring her back in 4 weeks for follow up and any further titration needed.  She will continue to work on lifestyle modifications to further help.     PharmD CPP [facility] [address] [place], [address] [place] [phone]

## 2020-12-29 ENCOUNTER — Encounter: Payer: Self-pay | Admitting: Pharmacist Clinician (PhC)/ Clinical Pharmacy Specialist

## 2020-12-29 MED ORDER — METOPROLOL SUCCINATE ER 50 MG PO TB24: 25.0000 mg | ORAL_TABLET | Freq: Every day | ORAL | 3 refills | Status: DC

## 2020-12-29 NOTE — Assessment & Plan Note (Signed)
Patient with essential hypertension, currently not at goal on amlodipine and metoprolol.  She was originally prescribed the metoprolol for palpitations, which seem to be mostly under control.  Because this is not a strong BP medication, we are going to try reducing the dose to 25 mg once daily and adding a low dose of chlorthalidone (12.5 mg) once daily.  This will hopefully allow her to get the diastolic pressure down further.  She should continue monitoring home BP readings and we will bring her back in 4 weeks for follow up and any further titration needed.  She will continue to work on lifestyle modifications to further help.

## 2021-01-01 NOTE — Progress Notes (Signed)
Anesthesia Follow-up:   Case: [id number] Date/Time: 01/19/21 0835   Procedure: DILATATION & CURETTAGE/HYSTEROSCOPY WITH RESECTOCOPE (N/A )   Anesthesia type: General   Pre-op diagnosis: DUB, endometrial polyp, endometrial thickening   [facility]: [facility] RO[facility] 07 / [facility]   Surgeons: Maxie Better, MD      DISCUSSION: See my previous APP note signed on 12/14/20. D&C/Hysteroscopy initially scheduled for 12/15/20, but delayed since she had not yet had recommended coronary CTA. Since then the coronary CT was done showing coronary calcium score of 0 with no evidence of CAD. She has also had virtual visit with Corine Shelter, PA-C on 12/26/19 for presurgical clearance and in-person visit at [facility] with Phillips Hay, RPH-CPP on 12/29/20 with further medication adjustments. Now surgery rescheduled for 01/19/21.   History includes never smoker, hepatitis "B", HTN, chest pain (~ 11/2020), morbid obesity.   Preoperative COVID-19 testing not yet scheduled.    VS:  Wt Readings from Last 3 Encounters:  12/28/20 (!) 146.1 kg  12/25/20 (!) 142.9 kg  11/23/20 (!) 145.6 kg   BP Readings from Last 3 Encounters:  12/28/20 134/82  12/25/20 (!) 141/92  12/20/20 134/77   Pulse Readings from Last 3 Encounters:  12/28/20 81  12/25/20 68  12/20/20 75    PROVIDERS: Diamantina Providence, FNP is PCP  Chilton Si, MD is cardiologist. Previously she saw Dyane Dustman, MD in 2020/2021 for palpitations. (see [facility] Everywhere).    LABS: Currently, last lab results include: Lab Results  Component Value Date   ALT 11 11/23/2020   AST 16 11/23/2020   NA 139 11/23/2020   K 4.5 11/23/2020   CL 104 11/23/2020   CREATININE 0.75 11/23/2020   BUN 9 11/23/2020   CO2 23 11/23/2020   TSH 2.110 11/23/2020     EKG: 11/23/20: NSR   CV: Coronary CT 12/20/20: IMPRESSION: 1. Coronary calcium score of 0. This was 0 percentile for age and sex matched control. 2. Normal coronary origin with  right dominance. 3. No evidence of CAD.  CAD-RADS 0. 4.  Consider non-cardiac causes for chest pain.  30 day Event monitor 06/24/19 ([facility]): Impression: Normal sinus rhythm predominates  Rare PVCs, PACs  4 runs of nonsustained ventricular tachycardia are noted, relatively slow  No significant bradycardia  According to 07/23/19 note by Dr. Chales Abrahams ([facility]): 05/20/19 echo showed: "Her echocardiogram demonstrated preserved left ventricular function and good valve function."   Past Medical History:  Diagnosis Date  . Angina pectoris (HCC) 11/23/2020  . Essential hypertension 11/23/2020  . Hepatitis    "b"  . Morbid obesity (HCC) 11/23/2020    Past Surgical History:  Procedure Laterality Date  . KNEE ARTHROSCOPY Right 08/22/2014   Procedure: ARTHROSCOPY KNEE;  Surgeon: Dannielle Huh, MD;  Location: [facility] OR;  Service: Orthopedics;  Laterality: Right;  Partial medial menisectomy    MEDICATIONS: No current facility-administered medications for this encounter.   Marland Kitchen amLODipine (NORVASC) 5 MG tablet  . chlorthalidone (HYGROTON) 25 MG tablet  . ibuprofen (ADVIL,MOTRIN) 200 MG tablet  . metoprolol succinate (TOPROL-XL) 50 MG 24 hr tablet     Shonna Chock, PA-C Surgical Short Stay/Anesthesiology [facility] Phone [phone] [facility] Phone [phone] 01/01/2021 4:43 PM

## 2021-01-10 NOTE — Progress Notes (Signed)
[facility] PREP Progress Report  Late entry for intake. Class start 12/26/20  Patient Details  Name: Susan Sutton MRN: [id number] Date of Birth: 12/11/1970 Age: 50 y.o. PCP: Diamantina Providence, FNP  Vitals:   12/26/20 1900  BP: (!) 150/80  Pulse: 81  SpO2: 97%  Weight: (!) 322 lb 9.6 oz (146.3 kg)  Height: 5\' 7"  (1.702 m)      [facility] Eval - 01/10/21 1600      Referral    Referring Provider 03/12/21    Reason for referral Inactivity;Hypertension    Program Start Date 12/26/20   T/TH 615-730pm x 12 weeks     Measurement   Waist Circumference 51.5 inches    Hip Circumference 59.5 inches    Body fat 49.5 percent      Information for Trainer   Goals Lose weight, come off BP meds, make better food choices    Current Exercise Walking dogs 2-3 x day    Orthopedic Concerns Back pain, sciatic pain, sits alot    Pertinent Medical History HTN, Pre diabetic, Hx of CAD    Current Barriers mindset    Medications that affect exercise Medication causing dizziness/drowsiness;Beta blocker      Timed Up and Go (TUGS)   Timed Up and Go Low risk <9 seconds      Mobility and Daily Activities   I find it easy to walk up or down two or more flights of stairs. 2    I have no trouble taking out the trash. 4    I do housework such as vacuuming and dusting on my own without difficulty. 4    I can easily lift a gallon of milk (8lbs). 4    I can easily walk a mile. 4    I have no trouble reaching into high cupboards or reaching down to pick up something from the floor. 2    I do not have trouble doing out-door work such as 12/28/20, raking leaves, or gardening. 2      Mobility and Daily Activities   I feel younger than my age. 2    I feel independent. 4    I feel energetic. 4    I live an active life.  2    I feel strong. 4    I feel healthy. 2    I feel active as other people my age. 2      How fit and strong are you.   Fit and Strong Total Score 42          Past Medical  History:  Diagnosis Date  . Angina pectoris (HCC) 11/23/2020  . Essential hypertension 11/23/2020  . Hepatitis    "b"  . Morbid obesity (HCC) 11/23/2020   Past Surgical History:  Procedure Laterality Date  . KNEE ARTHROSCOPY Right 08/22/2014   Procedure: ARTHROSCOPY KNEE;  Surgeon: 08/24/2014, MD;  Location: [facility] OR;  Service: Orthopedics;  Laterality: Right;  Partial medial menisectomy   Social History   Tobacco Use  Smoking Status Never Smoker  Smokeless Tobacco Never Used     1st action step: no sitting for more than 30 min.    [facility] 01/10/2021, 4:28 PM

## 2021-01-16 DIAGNOSIS — I10 Essential (primary) hypertension: Secondary | ICD-10-CM | POA: Diagnosis not present

## 2021-01-17 ENCOUNTER — Other Ambulatory Visit (HOSPITAL_COMMUNITY): Payer: Federal, State, Local not specified - PPO

## 2021-01-17 LAB — BASIC METABOLIC PANEL
BUN/Creatinine Ratio: 16 (ref 9–23)
BUN: 16 mg/dL (ref 6–24)
CO2: 25 mmol/L (ref 20–29)
Calcium: 9.5 mg/dL (ref 8.7–10.2)
Chloride: 104 mmol/L (ref 96–106)
Creatinine, Ser: 0.98 mg/dL (ref 0.57–1.00)
Glucose: 102 mg/dL — ABNORMAL HIGH (ref 65–99)
Potassium: 3.9 mmol/L (ref 3.5–5.2)
Sodium: 144 mmol/L (ref 134–144)
eGFR: 71 mL/min/{1.73_m2} (ref >59)

## 2021-01-18 ENCOUNTER — Other Ambulatory Visit (HOSPITAL_COMMUNITY)
Admission: RE | Admit: 2021-01-18 | Discharge: 2021-01-18 | Disposition: A | Discharge status: Home / Self Care | Payer: Federal, State, Local not specified - PPO | Source: Ambulatory Visit | Attending: Obstetrics and Gynecology | Admitting: Obstetrics and Gynecology

## 2021-01-18 ENCOUNTER — Encounter (HOSPITAL_COMMUNITY): Payer: Self-pay | Admitting: Obstetrics and Gynecology

## 2021-01-18 DIAGNOSIS — Z8371 Family history of colonic polyps: Secondary | ICD-10-CM | POA: Diagnosis not present

## 2021-01-18 DIAGNOSIS — N938 Other specified abnormal uterine and vaginal bleeding: Secondary | ICD-10-CM | POA: Diagnosis not present

## 2021-01-18 DIAGNOSIS — Z01812 Encounter for preprocedural laboratory examination: Secondary | ICD-10-CM | POA: Insufficient documentation

## 2021-01-18 DIAGNOSIS — Z20822 Contact with and (suspected) exposure to covid-19: Secondary | ICD-10-CM | POA: Insufficient documentation

## 2021-01-18 DIAGNOSIS — N840 Polyp of corpus uteri: Secondary | ICD-10-CM | POA: Diagnosis not present

## 2021-01-18 DIAGNOSIS — R9389 Abnormal findings on diagnostic imaging of other specified body structures: Secondary | ICD-10-CM | POA: Diagnosis not present

## 2021-01-18 DIAGNOSIS — Z8249 Family history of ischemic heart disease and other diseases of the circulatory system: Secondary | ICD-10-CM | POA: Diagnosis not present

## 2021-01-18 DIAGNOSIS — Z803 Family history of malignant neoplasm of breast: Secondary | ICD-10-CM | POA: Diagnosis not present

## 2021-01-18 DIAGNOSIS — Z833 Family history of diabetes mellitus: Secondary | ICD-10-CM | POA: Diagnosis not present

## 2021-01-18 DIAGNOSIS — Z8349 Family history of other endocrine, nutritional and metabolic diseases: Secondary | ICD-10-CM | POA: Diagnosis not present

## 2021-01-18 DIAGNOSIS — Z820 Family history of epilepsy and other diseases of the nervous system: Secondary | ICD-10-CM | POA: Diagnosis not present

## 2021-01-18 DIAGNOSIS — Z823 Family history of stroke: Secondary | ICD-10-CM | POA: Diagnosis not present

## 2021-01-18 LAB — SARS CORONAVIRUS 2 (TAT 6-24 HRS): SARS Coronavirus 2: NEGATIVE

## 2021-01-18 NOTE — Progress Notes (Addendum)
PCP - Dayton Scrape, NP Cardiologist - Dr Chilton Si  Chest x-ray - n/a EKG - 11/23/20 Stress Test - n/a ECHO - 05/20/19 [facility] Cardiac Cath - n/a  Anesthesia review: Yes  STOP now taking any Aspirin (unless otherwise instructed by your surgeon), Aleve, Naproxen, Ibuprofen, Motrin, Advil, Goody's, BC's, all herbal medications, fish oil, and all vitamins.   Coronavirus Screening Covid test is scheduled on 01/18/21. Do you have any of the following symptoms:  Cough yes/no: No Fever (>100.61F)  yes/no: No Runny nose yes/no: No Sore throat yes/no: No Difficulty breathing/shortness of breath  yes/no: No  Have you traveled in the last 14 days and where? yes/no: No  Patient verbalized understanding of instructions that were given via phone.

## 2021-01-19 ENCOUNTER — Other Ambulatory Visit: Payer: Self-pay

## 2021-01-19 ENCOUNTER — Encounter (HOSPITAL_COMMUNITY)
Admission: RE | Disposition: A | Discharge status: Home / Self Care | Payer: Self-pay | Source: Home / Self Care | Attending: Obstetrics and Gynecology

## 2021-01-19 ENCOUNTER — Ambulatory Visit (HOSPITAL_COMMUNITY): Payer: Federal, State, Local not specified - PPO | Admitting: Vascular Surgery

## 2021-01-19 ENCOUNTER — Ambulatory Visit (HOSPITAL_COMMUNITY)
Admission: RE | Admit: 2021-01-19 | Discharge: 2021-01-19 | Disposition: A | Discharge status: Home / Self Care | Payer: Federal, State, Local not specified - PPO | Attending: Obstetrics and Gynecology | Admitting: Obstetrics and Gynecology

## 2021-01-19 ENCOUNTER — Encounter (HOSPITAL_COMMUNITY): Payer: Self-pay | Admitting: Obstetrics and Gynecology

## 2021-01-19 DIAGNOSIS — Z8371 Family history of colonic polyps: Secondary | ICD-10-CM | POA: Diagnosis not present

## 2021-01-19 DIAGNOSIS — N840 Polyp of corpus uteri: Secondary | ICD-10-CM | POA: Diagnosis not present

## 2021-01-19 DIAGNOSIS — R7303 Prediabetes: Secondary | ICD-10-CM | POA: Diagnosis not present

## 2021-01-19 DIAGNOSIS — R9389 Abnormal findings on diagnostic imaging of other specified body structures: Secondary | ICD-10-CM | POA: Diagnosis not present

## 2021-01-19 DIAGNOSIS — D509 Iron deficiency anemia, unspecified: Secondary | ICD-10-CM | POA: Diagnosis not present

## 2021-01-19 DIAGNOSIS — Z20822 Contact with and (suspected) exposure to covid-19: Secondary | ICD-10-CM | POA: Insufficient documentation

## 2021-01-19 DIAGNOSIS — Z820 Family history of epilepsy and other diseases of the nervous system: Secondary | ICD-10-CM | POA: Insufficient documentation

## 2021-01-19 DIAGNOSIS — N938 Other specified abnormal uterine and vaginal bleeding: Secondary | ICD-10-CM | POA: Insufficient documentation

## 2021-01-19 DIAGNOSIS — Z8349 Family history of other endocrine, nutritional and metabolic diseases: Secondary | ICD-10-CM | POA: Diagnosis not present

## 2021-01-19 DIAGNOSIS — Z823 Family history of stroke: Secondary | ICD-10-CM | POA: Diagnosis not present

## 2021-01-19 DIAGNOSIS — Z8249 Family history of ischemic heart disease and other diseases of the circulatory system: Secondary | ICD-10-CM | POA: Diagnosis not present

## 2021-01-19 DIAGNOSIS — Z803 Family history of malignant neoplasm of breast: Secondary | ICD-10-CM | POA: Diagnosis not present

## 2021-01-19 DIAGNOSIS — Z833 Family history of diabetes mellitus: Secondary | ICD-10-CM | POA: Diagnosis not present

## 2021-01-19 HISTORY — PX: DILATATION & CURRETTAGE/HYSTEROSCOPY WITH RESECTOCOPE: SHX5572

## 2021-01-19 HISTORY — DX: Prediabetes: R73.03

## 2021-01-19 LAB — ABO/RH: ABO/RH(D): AB POS

## 2021-01-19 LAB — TYPE AND SCREEN
ABO/RH(D): AB POS
Antibody Screen: NEGATIVE

## 2021-01-19 LAB — CBC
HCT: 38 % (ref 36.0–46.0)
Hemoglobin: 12.7 g/dL (ref 12.0–15.0)
MCH: 28.2 pg (ref 26.0–34.0)
MCHC: 33.4 g/dL (ref 30.0–36.0)
MCV: 84.3 fL (ref 80.0–100.0)
Platelets: 284 10*3/uL (ref 150–400)
RBC: 4.51 MIL/uL (ref 3.87–5.11)
RDW: 12.9 % (ref 11.5–15.5)
WBC: 5 10*3/uL (ref 4.0–10.5)
nRBC: 0 % (ref 0.0–0.2)

## 2021-01-19 LAB — GLUCOSE, CAPILLARY
Glucose-Capillary: 137 mg/dL — ABNORMAL HIGH (ref 70–99)
Glucose-Capillary: 90 mg/dL (ref 70–99)

## 2021-01-19 SURGERY — DILATATION & CURETTAGE/HYSTEROSCOPY WITH RESECTOCOPE
Anesthesia: General

## 2021-01-19 MED ORDER — FENTANYL CITRATE (PF) 100 MCG/2ML IJ SOLN
INTRAMUSCULAR | Status: DC | PRN
  Administered 2021-01-19: 100 ug via INTRAVENOUS

## 2021-01-19 MED ORDER — ONDANSETRON HCL 4 MG/2ML IJ SOLN: 4.0000 mg | Freq: Once | INTRAMUSCULAR | Status: DC | PRN

## 2021-01-19 MED ORDER — SODIUM CHLORIDE 0.9 % IR SOLN
Status: DC | PRN
  Administered 2021-01-19: 6000 mL

## 2021-01-19 MED ORDER — SUCCINYLCHOLINE CHLORIDE 200 MG/10ML IV SOSY
PREFILLED_SYRINGE | INTRAVENOUS | Status: DC | PRN
  Administered 2021-01-19: 140 mg via INTRAVENOUS

## 2021-01-19 MED ORDER — DEXAMETHASONE SODIUM PHOSPHATE 10 MG/ML IJ SOLN
INTRAMUSCULAR | Status: DC | PRN
  Administered 2021-01-19: 10 mg via INTRAVENOUS

## 2021-01-19 MED ORDER — CHLORHEXIDINE GLUCONATE 0.12 % MT SOLN
15.0000 mL | Freq: Once | OROMUCOSAL | Status: AC
  Administered 2021-01-19: 15 mL via OROMUCOSAL
  Filled 2021-01-19: qty 15

## 2021-01-19 MED ORDER — KETOROLAC TROMETHAMINE 30 MG/ML IJ SOLN
INTRAMUSCULAR | Status: DC | PRN
  Administered 2021-01-19: 30 mg via INTRAVENOUS

## 2021-01-19 MED ORDER — PROPOFOL 10 MG/ML IV BOLUS
INTRAVENOUS | Status: DC | PRN
  Administered 2021-01-19: 200 mg via INTRAVENOUS

## 2021-01-19 MED ORDER — OXYCODONE HCL 5 MG PO TABS: 5.0000 mg | ORAL_TABLET | Freq: Once | ORAL | Status: DC | PRN

## 2021-01-19 MED ORDER — DEXAMETHASONE SODIUM PHOSPHATE 10 MG/ML IJ SOLN
INTRAMUSCULAR | Status: AC
  Filled 2021-01-19: qty 1

## 2021-01-19 MED ORDER — KETOROLAC TROMETHAMINE 30 MG/ML IJ SOLN
INTRAMUSCULAR | Status: AC
  Filled 2021-01-19: qty 1

## 2021-01-19 MED ORDER — LIDOCAINE 2% (20 MG/ML) 5 ML SYRINGE
INTRAMUSCULAR | Status: AC
  Filled 2021-01-19: qty 5

## 2021-01-19 MED ORDER — ORAL CARE MOUTH RINSE: 15.0000 mL | Freq: Once | OROMUCOSAL | Status: AC

## 2021-01-19 MED ORDER — OXYCODONE HCL 5 MG/5ML PO SOLN: 5.0000 mg | Freq: Once | ORAL | Status: DC | PRN

## 2021-01-19 MED ORDER — PROPOFOL 10 MG/ML IV BOLUS
INTRAVENOUS | Status: AC
  Filled 2021-01-19: qty 20

## 2021-01-19 MED ORDER — MIDAZOLAM HCL 2 MG/2ML IJ SOLN
INTRAMUSCULAR | Status: AC
  Filled 2021-01-19: qty 2

## 2021-01-19 MED ORDER — LIDOCAINE 2% (20 MG/ML) 5 ML SYRINGE
INTRAMUSCULAR | Status: DC | PRN
  Administered 2021-01-19: 100 mg via INTRAVENOUS

## 2021-01-19 MED ORDER — FENTANYL CITRATE (PF) 100 MCG/2ML IJ SOLN: 25.0000 ug | INTRAMUSCULAR | Status: DC | PRN

## 2021-01-19 MED ORDER — AMISULPRIDE (ANTIEMETIC) 5 MG/2ML IV SOLN: 10.0000 mg | Freq: Once | INTRAVENOUS | Status: DC | PRN

## 2021-01-19 MED ORDER — ONDANSETRON HCL 4 MG/2ML IJ SOLN
INTRAMUSCULAR | Status: DC | PRN
  Administered 2021-01-19: 4 mg via INTRAVENOUS

## 2021-01-19 MED ORDER — FENTANYL CITRATE (PF) 250 MCG/5ML IJ SOLN
INTRAMUSCULAR | Status: AC
  Filled 2021-01-19: qty 5

## 2021-01-19 MED ORDER — LACTATED RINGERS IV SOLN: INTRAVENOUS | Status: DC

## 2021-01-19 MED ORDER — POVIDONE-IODINE 10 % EX SWAB
2.0000 "application " | Freq: Once | CUTANEOUS | Status: AC
  Administered 2021-01-19: 2 via TOPICAL

## 2021-01-19 MED ORDER — MIDAZOLAM HCL 5 MG/5ML IJ SOLN
INTRAMUSCULAR | Status: DC | PRN
  Administered 2021-01-19: 2 mg via INTRAVENOUS

## 2021-01-19 MED ORDER — ONDANSETRON HCL 4 MG/2ML IJ SOLN
INTRAMUSCULAR | Status: AC
  Filled 2021-01-19: qty 2

## 2021-01-19 SURGICAL SUPPLY — 15 items
BIPOLAR CUTTING LOOP 21FR (ELECTRODE)
CATH ROBINSON RED A/P 16FR (CATHETERS) ×2 IMPLANT
DEVICE MYOSURE REACH (MISCELLANEOUS) ×2 IMPLANT
ELECT REM PT RETURN 9FT ADLT (ELECTROSURGICAL) ×2
ELECTRODE REM PT RTRN 9FT ADLT (ELECTROSURGICAL) ×1 IMPLANT
GLOVE ECLIPSE 6.5 STRL STRAW (GLOVE) ×2 IMPLANT
GLOVE SURG UNDER POLY LF SZ7 (GLOVE) ×4 IMPLANT
GOWN STRL REUS W/ TWL LRG LVL3 (GOWN DISPOSABLE) ×2 IMPLANT
GOWN STRL REUS W/TWL LRG LVL3 (GOWN DISPOSABLE) ×2
KIT PROCEDURE FLUENT (KITS) ×2 IMPLANT
LOOP CUTTING BIPOLAR 21FR (ELECTRODE) IMPLANT
PACK VAGINAL MINOR WOMEN LF (CUSTOM PROCEDURE TRAY) ×2 IMPLANT
PAD OB MATERNITY 4.3X12.25 (PERSONAL CARE ITEMS) ×2 IMPLANT
SEAL ROD LENS SCOPE MYOSURE (ABLATOR) ×2 IMPLANT
TOWEL GREEN STERILE FF (TOWEL DISPOSABLE) ×4 IMPLANT

## 2021-01-19 NOTE — H&P (Signed)
Susan Sutton is an 50 y.o. female G1P0010 BF here for  Diagnostic hysteroscopy, D&C, resection of endometrial polyps due to AUB noted on sonohysterogram  Pertinent Gynecological History: Menses: DUB Bleeding: dysfunctional uterine bleeding Contraception: none DES exposure: denies Blood transfusions: none Sexually transmitted diseases: no past history Previous GYN Procedures: DNC  Last mammogram: normal Date: 2021 Last pap: normal Date: 2021 OB History: G1, P0010   Menstrual History: Menarche age: n/a Patient's last menstrual period was 12/02/2020 (approximate).    Past Medical History:  Diagnosis Date  . Angina pectoris (HCC) 11/23/2020  . Essential hypertension 11/23/2020  . Hepatitis    "b"   . Morbid obesity (HCC) 11/23/2020  . Pre-diabetes    diet controlled - no meds- does not check blood sugar    Past Surgical History:  Procedure Laterality Date  . KNEE ARTHROSCOPY Right 08/22/2014   Procedure: ARTHROSCOPY KNEE;  Surgeon: Dannielle Huh, MD;  Location: [facility] OR;  Service: Orthopedics;  Laterality: Right;  Partial medial menisectomy  . WISDOM TOOTH EXTRACTION      Family History  Problem Relation Age of Onset  . Heart attack Mother   . Hypertension Mother   . Congestive Heart Failure Mother   . Diabetes Mother   . Stroke Mother   . Diabetes Father   . Hypertension Father   . Heart attack Father   . Congestive Heart Failure Father   . Diabetes Sister   . Hypertension Sister   . Hyperlipidemia Sister   . Lupus Sister   . Colon polyps Sister   . Hypertension Brother   . Breast cancer Paternal Grandmother   . Breast cancer Paternal Aunt   . Colon cancer Paternal Uncle   . Hypertension Maternal Grandmother   . Diabetes Maternal Grandmother   . Epilepsy Maternal Grandmother   . Alzheimer's disease Maternal Grandmother     Social History:  reports that she has never smoked. She has never used smokeless tobacco. She reports current alcohol use. She reports that she  does not use drugs.  Allergies: No Known Allergies  No medications prior to admission.    Review of Systems  All other systems reviewed and are negative.   Height 5\' 7"  (1.702 m), weight (!) 147.4 kg, last menstrual period 12/02/2020. Physical Exam Constitutional:      Appearance: Normal appearance. She is obese.  Eyes:     Extraocular Movements: Extraocular movements intact.  Cardiovascular:     Rate and Rhythm: Regular rhythm.     Heart sounds: Normal heart sounds.  Pulmonary:     Breath sounds: No wheezing.  Abdominal:     Palpations: Abdomen is soft. There is no mass.     Tenderness: There is no abdominal tenderness.  Genitourinary:    General: Normal vulva.     Comments: Vagina nl lesion Cervix. Nulliparous Adnexa non palp Uterus AV Musculoskeletal:        General: Normal range of motion.     Cervical back: Neck supple.  Skin:    General: Skin is warm and dry.  Neurological:     Mental Status: She is alert and oriented to person, place, and time.  Psychiatric:        Mood and Affect: Mood normal.        Behavior: Behavior normal.     Results for orders placed or performed during the hospital encounter of 01/18/21 (from the past 24 hour(s))  SARS CORONAVIRUS 2 (TAT 6-24 HRS) Nasopharyngeal Nasopharyngeal Swab  Status: None   Collection Time: 01/18/21  8:46 AM   Specimen: Nasopharyngeal Swab  Result Value Ref Range   SARS Coronavirus 2 NEGATIVE NEGATIVE    No results found.  Assessment/Plan: AUB Endometrial thickening on sonogram Endometrial polyp P) dx hysteroscopy, D&C, hysteroscopic resection of endometrial polyp. Risk of surgery reviewed including infection, Bleeding, injury to surrounding organ structures, thermal injury, fluid overload ans its risk/mgmt, uterine perforation( 11/998) and its risk. All ? answered  Susan Sutton A Alisson Rozell 01/19/2021, 3:06 AM

## 2021-01-19 NOTE — Brief Op Note (Signed)
01/19/2021  9:49 AM  PATIENT:  Susan Sutton  50 y.o. female  PRE-OPERATIVE DIAGNOSIS:  DUB, endometrial polyp, endometrial thickening on sonogram  POST-OPERATIVE DIAGNOSIS: DUB, endometrial thickening on sonogram, endometrial polyps    PROCEDURE:  Diagnostic hysteroscopy, hysteroscopic resection of endometrial polyps using myosure, dilation and curettage  SURGEON:  Surgeon(s) and Role:    * Maxie Better, MD - Primary  PHYSICIAN ASSISTANT:   ASSISTANTS: none   ANESTHESIA:   general Findings: multiple polypoids lesions throughout the endometrium, arcuate uterus, tubal ostia seen, nl endocervical canal EBL:  96ml BLOOD ADMINISTERED:none  DRAINS: none   LOCAL MEDICATIONS USED:  NONE  SPECIMEN:  Source of Specimen:  endometrial curettings with endometrial polyps  DISPOSITION OF SPECIMEN:  PATHOLOGY  COUNTS:  YES  TOURNIQUET:  * No tourniquets in log *  DICTATION: .Other Dictation: Dictation Number [id number]  PLAN OF CARE: Discharge to home after PACU  PATIENT DISPOSITION:  PACU - hemodynamically stable.   Delay start of Pharmacological VTE agent (>24hrs) due to surgical blood loss or risk of bleeding: no

## 2021-01-19 NOTE — Interval H&P Note (Signed)
History and Physical Interval Note:  01/19/2021 7:38 AM  [name]  has presented today for surgery, with the diagnosis of DUB, endometrial polyp, endometrial thickening.  The various methods of treatment have been discussed with the patient and family. After consideration of risks, benefits and other options for treatment, the patient has consented to  Procedure(s): DILATATION & CURETTAGE/HYSTEROSCOPY WITH RESECTOCOPE (N/A) as a surgical intervention.  The patient's history has been reviewed, patient examined, no change in status, stable for surgery.  I have reviewed the patient's chart and labs.  Questions were answered to the patient's satisfaction.     Susan Sutton A Accalia Rigdon

## 2021-01-19 NOTE — Anesthesia Postprocedure Evaluation (Signed)
Anesthesia Post Note  Patient: Susan Sutton  Procedure(s) Performed: DILATATION & CURETTAGE/HYSTEROSCOPY WITH MYOSURE (N/A )     Patient location during evaluation: PACU Anesthesia Type: General Level of consciousness: awake and alert Pain management: pain level controlled Vital Signs Assessment: post-procedure vital signs reviewed and stable Respiratory status: spontaneous breathing, nonlabored ventilation and respiratory function stable Cardiovascular status: blood pressure returned to baseline and stable Postop Assessment: no apparent nausea or vomiting Anesthetic complications: no   No complications documented.  Last Vitals:  Vitals:   01/19/21 1045 01/19/21 1050  BP: (!) 161/95 (!) 162/88  Pulse: 74 79  Resp: 17 16  Temp: 36.5 C   SpO2: 100% 100%    Last Pain:  Vitals:   01/19/21 1045  TempSrc:   PainSc: 3                  Lucretia Kern

## 2021-01-19 NOTE — Anesthesia Procedure Notes (Signed)
Procedure Name: Intubation Date/Time: 01/19/2021 9:17 AM Performed by: Lovie Chol, CRNA Pre-anesthesia Checklist: Patient identified, Emergency Drugs available, Suction available and Patient being monitored Patient Re-evaluated:Patient Re-evaluated prior to induction Oxygen Delivery Method: Circle System Utilized Preoxygenation: Pre-oxygenation with 100% oxygen Induction Type: IV induction Ventilation: Mask ventilation without difficulty Laryngoscope Size: Miller and 3 Grade View: Grade I Tube type: Oral Tube size: 7.5 mm Number of attempts: 1 Airway Equipment and Method: Stylet Placement Confirmation: ETT inserted through vocal cords under direct vision,  positive ETCO2 and breath sounds checked- equal and bilateral Secured at: 22 cm Tube secured with: Tape Dental Injury: Teeth and Oropharynx as per pre-operative assessment

## 2021-01-19 NOTE — Anesthesia Preprocedure Evaluation (Addendum)
Anesthesia Evaluation  Patient identified by MRN, date of birth, ID band Patient awake    Reviewed: Allergy & Precautions, NPO status , Patient's Chart, lab work & pertinent test results  History of Anesthesia Complications Negative for: history of anesthetic complications  Airway Mallampati: II  TM Distance: >3 FB Neck ROM: Full    Dental  (+) Teeth Intact, Dental Advisory Given   Pulmonary neg pulmonary ROS,    Pulmonary exam normal        Cardiovascular hypertension, Normal cardiovascular exam     Neuro/Psych negative neurological ROS  negative psych ROS   GI/Hepatic negative GI ROS, (+) Hepatitis -, B  Endo/Other  diabetes (pre-diabetes)Morbid obesity  Renal/GU negative Renal ROS  negative genitourinary   Musculoskeletal negative musculoskeletal ROS (+)   Abdominal   Peds  Hematology negative hematology ROS (+)   Anesthesia Other Findings  D&C/Hysteroscopy initially scheduled for 12/15/20, but delayed since she had not yet had recommended coronary CTA. Since then the coronary CT was done showing coronary calcium score of 0 with no evidence of CAD. She has also had virtual visit with Corine Shelter, PA-C on 12/26/19 for presurgical clearance and in-person visit at the [facility] with Phillips Hay, RPH-CPP on 12/29/20 with further medication adjustments. Now surgery rescheduled for 01/19/21.   Reproductive/Obstetrics                            Anesthesia Physical Anesthesia Plan  ASA: III  Anesthesia Plan: General   Post-op Pain Management:    Induction: Intravenous  PONV Risk Score and Plan: 3 and Ondansetron, Dexamethasone, Midazolam and Treatment may vary due to age or medical condition  Airway Management Planned: Oral ETT  Additional Equipment: None  Intra-op Plan:   Post-operative Plan: Extubation in OR  Informed Consent: I have reviewed the patients History and Physical,  chart, labs and discussed the procedure including the risks, benefits and alternatives for the proposed anesthesia with the patient or authorized representative who has indicated his/her understanding and acceptance.     Dental advisory given  Plan Discussed with:   Anesthesia Plan Comments:         Anesthesia Quick Evaluation

## 2021-01-19 NOTE — Transfer of Care (Signed)
Immediate Anesthesia Transfer of Care Note  Patient: Susan Sutton  Procedure(s) Performed: DILATATION & CURETTAGE/HYSTEROSCOPY WITH MYOSURE (N/A )  Patient Location: PACU  Anesthesia Type:General  Level of Consciousness: awake, oriented and patient cooperative  Airway & Oxygen Therapy: Patient Spontanous Breathing and Patient connected to nasal cannula oxygen  Post-op Assessment: Report given to RN and Post -op Vital signs reviewed and stable  Post vital signs: Reviewed  Last Vitals:  Vitals Value Taken Time  BP 162/88 01/19/21 1050  Temp 36.5 C 01/19/21 1045  Pulse 77 01/19/21 1055  Resp 17 01/19/21 1055  SpO2 100 % 01/19/21 1055  Vitals shown include unvalidated device data.  Last Pain:  Vitals:   01/19/21 1045  TempSrc:   PainSc: 3          Complications: No complications documented.

## 2021-01-20 ENCOUNTER — Encounter (HOSPITAL_COMMUNITY): Payer: Self-pay | Admitting: Obstetrics and Gynecology

## 2021-01-20 NOTE — Op Note (Signed)
Susan Sutton, Susan Sutton MEDICAL RECORD NO: [id number] ACCOUNT NO: [id number] DATE OF BIRTH: 15-Jun-1971 FACILITY: [facility] LOCATION: [facility] PHYSICIAN: Krupa Stege A. Onnie Alatorre, MD  Operative Report   PREOPERATIVE DIAGNOSES:  Dysfunctional uterine bleeding, endometrial polyp, endometrial thickening on sonogram.  PROCEDURE:  Diagnostic hysteroscopy, hysteroscopic resection of endometrial polyps using MyoSure, dilation and curettage.  POSTOPERATIVE DIAGNOSES:  Dysfunctional uterine bleeding, endometrial thickening on sonogram, endometrial polyps.  ANESTHESIA:  General.  SURGEON:  Denaisha Swango A. Cherly Hensen, MD  ASSISTANT:  None.  DESCRIPTION OF PROCEDURE:  Under adequate general anesthesia, the patient was placed in the dorsal lithotomy position.  She was sterilely prepped and draped in the usual fashion.  Bladder was catheterized of moderate amount of urine.  Examination under  anesthesia revealed anteverted uterus.  No adnexal masses could be appreciated, but limited by the patient's body habitus.  A bivalve speculum was placed in the vagina.  A single-tooth tenaculum was placed on the anterior lip of the cervix.  The cervix  was easily dilated up to #19 [facility] dilator.  A diagnostic hysteroscope was introduced into the uterine cavity.  Multiple polypoid lesions were noted throughout obscuring the tubal ostias.   Using the Reach MyoSure resectoscope, the entire endometrium and  all the polypoid lesions were removed with resultant viewing of both tubal ostias. The uterus gave the appearance of an arcuate uterus.  Nonetheless, the endometrium was also resected by using the MyoSure resectoscope.  When all tissue was felt to have  been completely removed, the endocervical canal was inspected.  No lesions noted.  All instruments were then removed from the vagina.  Specimen labeled endometrial curettings with polyps were sent to pathology.  Fluid deficit was 975 mL.  Complication  was none.  The patient tolerated  the procedure well and was transferred to recovery room in stable condition.   NIK D: 01/19/2021 10:32:40 am T: 01/20/2021 3:34:00 am  JOB: [id number]/ [id number]

## 2021-01-22 LAB — SURGICAL PATHOLOGY

## 2021-01-22 LAB — POCT PREGNANCY, URINE: Preg Test, Ur: NEGATIVE

## 2021-01-25 ENCOUNTER — Ambulatory Visit: Payer: Federal, State, Local not specified - PPO

## 2021-01-25 NOTE — Progress Notes (Deleted)
     HPI:  Susan Sutton is a 50 y.o. female patient of Dr Duke Salvia, who presents today for advanced hypertension clinic follow up.   In addition to hypertension, her medical history is significant for morbid obesity, pre-diabetes and NSVT.    She returns today for follow up.   Blood Pressure Goal:  130/80  Current Medications:  amlodipine 5 mg daily Chlorthalidone 12.5mg  daily metoprolol succ 25 mg daily  Family Hx: mother's family has strong history of heart disease - mother died at 16 from MI, also had DM, amputation; maternal grandmother and aunts with cardiac issues, maternal cousin died from MI at 20, another has AF; father had hypertension, DM, died from drug abuse issues, more cancers in his family.     Social Hx: no tobacco, rare alcohol, coffee 12-14oz daily, trying to cut back, drinking less sweet tea (now 1 gal twice weekly instead of most days)  Diet: some eating out but not much, has recently switched to using salt while cooking as opposed to at the table,  not as much veggies as would like - she is single, hard to cook vegetables for 1,   Exercise:  PREP program??  Home BP readings: no readings in office lowest 132/83 approx, highest 158/94  Intolerances: previously had edema with amlodipine, tolerating well now  Labs: 1/22: Na 139, K 4.5, Glu 97, BUN 9, SCr 0.75, GFR 109   Wt Readings from Last 3 Encounters:  01/19/21 (!) 320 lb (145.2 kg)  12/28/20 (!) 322 lb (146.1 kg)  12/26/20 (!) 322 lb 9.6 oz (146.3 kg)   BP Readings from Last 3 Encounters:  01/19/21 (!) 162/88  12/28/20 134/82  12/26/20 (!) 150/80   Pulse Readings from Last 3 Encounters:  01/19/21 79  12/28/20 81  12/26/20 81    Current Outpatient Medications  Medication Sig Dispense Refill  . acetaminophen (TYLENOL) 650 MG CR tablet Take 650 mg by mouth every 8 (eight) hours as needed for pain.    Marland Kitchen amLODipine (NORVASC) 5 MG tablet Take 1 tablet (5 mg total) by mouth daily. (Patient taking  differently: Take 5 mg by mouth at bedtime.) 90 tablet 3  . chlorthalidone (HYGROTON) 25 MG tablet Take 0.5 tablets (12.5 mg total) by mouth daily. (Patient taking differently: Take 12.5 mg by mouth at bedtime.) 15 tablet 3  . ibuprofen (ADVIL,MOTRIN) 200 MG tablet Take 4 tablets (800 mg total) by mouth 3 (three) times daily. (Patient taking differently: Take 600-800 mg by mouth daily as needed for mild pain or moderate pain.) 90 tablet 1  . metoprolol succinate (TOPROL-XL) 50 MG 24 hr tablet Take 0.5 tablets (25 mg total) by mouth daily. Take with or immediately following a meal. (Patient taking differently: Take 25 mg by mouth at bedtime. Take with or immediately following a meal.) 90 tablet 3   No current facility-administered medications for this visit.    No Known Allergies  Past Medical History:  Diagnosis Date  . Angina pectoris (HCC) 11/23/2020  . Essential hypertension 11/23/2020  . Hepatitis    "b"   . Morbid obesity (HCC) 11/23/2020  . Pre-diabetes    diet controlled - no meds- does not check blood sugar    There were no vitals taken for this visit.  No problem-specific Assessment & Plan notes found for this encounter.   Thomasa Heidler Rodriguez-Guzman PharmD, BCPS, CPP [facility] [address] [place] [address] 01/25/2021 3:16 PM

## 2021-02-14 ENCOUNTER — Telehealth: Payer: Self-pay

## 2021-02-14 NOTE — Telephone Encounter (Signed)
Noted missed PREP classes Text sent to pt. Her work/surgery/travel has gotten in the way of participating in PREP Requests to try a future class instead Next proposed evening class will be at the end of May. Will  Contact her when next one starts She is agreeable

## 2021-02-18 ENCOUNTER — Emergency Department (HOSPITAL_COMMUNITY): Payer: Federal, State, Local not specified - PPO

## 2021-02-18 ENCOUNTER — Emergency Department (HOSPITAL_COMMUNITY)
Admission: EM | Admit: 2021-02-18 | Discharge: 2021-02-18 | Disposition: A | Discharge status: Home / Self Care | Payer: Federal, State, Local not specified - PPO | Attending: Emergency Medicine | Admitting: Emergency Medicine

## 2021-02-18 ENCOUNTER — Encounter (HOSPITAL_COMMUNITY): Payer: Self-pay

## 2021-02-18 DIAGNOSIS — R200 Anesthesia of skin: Secondary | ICD-10-CM | POA: Diagnosis not present

## 2021-02-18 DIAGNOSIS — Z79899 Other long term (current) drug therapy: Secondary | ICD-10-CM | POA: Diagnosis not present

## 2021-02-18 DIAGNOSIS — I10 Essential (primary) hypertension: Secondary | ICD-10-CM | POA: Diagnosis not present

## 2021-02-18 DIAGNOSIS — R202 Paresthesia of skin: Secondary | ICD-10-CM | POA: Insufficient documentation

## 2021-02-18 DIAGNOSIS — R519 Headache, unspecified: Secondary | ICD-10-CM | POA: Diagnosis not present

## 2021-02-18 LAB — CBC WITH DIFFERENTIAL/PLATELET
Abs Immature Granulocytes: 0.01 K/uL (ref 0.00–0.07)
Basophils Absolute: 0 K/uL (ref 0.0–0.1)
Basophils Relative: 1 %
Eosinophils Absolute: 0.1 K/uL (ref 0.0–0.5)
Eosinophils Relative: 3 %
HCT: 38.6 % (ref 36.0–46.0)
Hemoglobin: 12.5 g/dL (ref 12.0–15.0)
Immature Granulocytes: 0 %
Lymphocytes Relative: 43 %
Lymphs Abs: 2.1 K/uL (ref 0.7–4.0)
MCH: 27.5 pg (ref 26.0–34.0)
MCHC: 32.4 g/dL (ref 30.0–36.0)
MCV: 84.8 fL (ref 80.0–100.0)
Monocytes Absolute: 0.6 K/uL (ref 0.1–1.0)
Monocytes Relative: 12 %
Neutro Abs: 1.9 K/uL (ref 1.7–7.7)
Neutrophils Relative %: 41 %
Platelets: 281 K/uL (ref 150–400)
RBC: 4.55 MIL/uL (ref 3.87–5.11)
RDW: 13.3 % (ref 11.5–15.5)
WBC: 4.8 K/uL (ref 4.0–10.5)
nRBC: 0 % (ref 0.0–0.2)

## 2021-02-18 LAB — BASIC METABOLIC PANEL WITH GFR
Anion gap: 6 (ref 5–15)
BUN: 15 mg/dL (ref 6–20)
CO2: 27 mmol/L (ref 22–32)
Calcium: 9 mg/dL (ref 8.9–10.3)
Chloride: 103 mmol/L (ref 98–111)
Creatinine, Ser: 0.95 mg/dL (ref 0.44–1.00)
GFR, Estimated: >60 mL/min (ref >60)
Glucose, Bld: 107 mg/dL — ABNORMAL HIGH (ref 70–99)
Potassium: 3.4 mmol/L — ABNORMAL LOW (ref 3.5–5.1)
Sodium: 136 mmol/L (ref 135–145)

## 2021-02-18 LAB — MAGNESIUM: Magnesium: 1.9 mg/dL (ref 1.7–2.4)

## 2021-02-18 MED ORDER — POTASSIUM CHLORIDE CRYS ER 20 MEQ PO TBCR
40.0000 meq | EXTENDED_RELEASE_TABLET | Freq: Once | ORAL | Status: AC
  Administered 2021-02-18: 40 meq via ORAL
  Filled 2021-02-18: qty 2

## 2021-02-18 MED ORDER — IOHEXOL 350 MG/ML SOLN
100.0000 mL | Freq: Once | INTRAVENOUS | Status: AC | PRN
  Administered 2021-02-18: 100 mL via INTRAVENOUS

## 2021-02-18 NOTE — ED Triage Notes (Signed)
Patient arrived stating since Thursday she has had intermittent feelings of full body left sided tingling/numbness which resolve shortly after, described similar to the felling of receiving IV contrast. Declines any weakness, dizziness, or vomiting.

## 2021-02-18 NOTE — ED Provider Notes (Signed)
[facility]EMERGENCY DEPT Provider Note: Susan Dell, MD, FACEP  CSN: [id number] MRN: [id number] ARRIVAL: 02/18/21 at 0129 ROOM: WA04/WA04   CHIEF COMPLAINT  Tingling   HISTORY OF PRESENT ILLNESS  02/18/21 3:38 AM Susan Sutton is a 50 y.o. female with 3 days of intermittent left-sided tingling.  These episodes began at the top of her head and proceeded rapidly down the left side of her body.  They last maybe 1 to 2 seconds and resolve on their own.  Nothing makes them better or worse.  They are not severe.  She has had no associated weakness.  She has had no dizziness or vomiting.  She has has no visual changes.  She likens the sensation to that of receiving IV contrast.  These episodes have been followed by a mild bifrontal headache which has not been severe enough to take anything for it.    Past Medical History:  Diagnosis Date  . Angina pectoris (HCC) 11/23/2020  . Essential hypertension 11/23/2020  . Hepatitis    "b"   . Morbid obesity (HCC) 11/23/2020  . Pre-diabetes    diet controlled - no meds- does not check blood sugar    Past Surgical History:  Procedure Laterality Date  . DILATATION & CURRETTAGE/HYSTEROSCOPY WITH RESECTOCOPE N/A 01/19/2021   Procedure: DILATATION & CURETTAGE/HYSTEROSCOPY WITH MYOSURE;  Surgeon: Maxie Better, MD;  Location: [facility] OR;  Service: Gynecology;  Laterality: N/A;  . KNEE ARTHROSCOPY Right 08/22/2014   Procedure: ARTHROSCOPY KNEE;  Surgeon: Dannielle Huh, MD;  Location: [facility] OR;  Service: Orthopedics;  Laterality: Right;  Partial medial menisectomy  . WISDOM TOOTH EXTRACTION      Family History  Problem Relation Age of Onset  . Heart attack Mother   . Hypertension Mother   . Congestive Heart Failure Mother   . Diabetes Mother   . Stroke Mother   . Diabetes Father   . Hypertension Father   . Heart attack Father   . Congestive Heart Failure Father   . Diabetes Sister   . Hypertension Sister   . Hyperlipidemia Sister   . Lupus Sister   .  Colon polyps Sister   . Hypertension Brother   . Breast cancer Paternal Grandmother   . Breast cancer Paternal Aunt   . Colon cancer Paternal Uncle   . Hypertension Maternal Grandmother   . Diabetes Maternal Grandmother   . Epilepsy Maternal Grandmother   . Alzheimer's disease Maternal Grandmother     Social History   Tobacco Use  . Smoking status: Never Smoker  . Smokeless tobacco: Never Used  Vaping Use  . Vaping Use: Never used  Substance Use Topics  . Alcohol use: Yes    Comment: occ  . Drug use: No    Prior to Admission medications   Medication Sig Start Date End Date Taking? Authorizing Provider  acetaminophen (TYLENOL) 650 MG CR tablet Take 650 mg by mouth every 8 (eight) hours as needed for pain.    [provider]  amLODipine (NORVASC) 5 MG tablet Take 1 tablet (5 mg total) by mouth daily. Patient taking differently: Take 5 mg by mouth at bedtime. 11/23/20 02/21/21  Chilton Si, MD  chlorthalidone (HYGROTON) 25 MG tablet Take 0.5 tablets (12.5 mg total) by mouth daily. Patient taking differently: Take 12.5 mg by mouth at bedtime. 12/28/20 03/28/21  Chilton Si, MD  ibuprofen (ADVIL,MOTRIN) 200 MG tablet Take 4 tablets (800 mg total) by mouth 3 (three) times daily. Patient taking differently: Take 600-800 mg by  mouth daily as needed for mild pain or moderate pain. 08/22/14   Altamese Cabal, PA-C  metoprolol succinate (TOPROL-XL) 50 MG 24 hr tablet Take 0.5 tablets (25 mg total) by mouth daily. Take with or immediately following a meal. Patient taking differently: Take 25 mg by mouth at bedtime. Take with or immediately following a meal. 12/29/20 03/29/21  Chilton Si, MD    Allergies Patient has no known allergies.   REVIEW OF SYSTEMS  Negative except as noted here or in the History of Present Illness.   PHYSICAL EXAMINATION  Initial Vital Signs Blood pressure (!) 148/81, pulse 66, temperature 98.1 F (36.7 C), temperature source Oral,  resp. rate 16, SpO2 100 %.  Examination General: Well-developed, well-nourished female in no acute distress; appearance consistent with age of record HENT: normocephalic; atraumatic Eyes: pupils equal, round and reactive to light; extraocular muscles intact Neck: supple Heart: regular rate and rhythm Lungs: clear to auscultation bilaterally Abdomen: soft; nondistended; nontender; bowel sounds present Extremities: No deformity; full range of motion; pulses normal Neurologic: Awake, alert and oriented; motor function intact in all extremities and symmetric; no facial droop Skin: Warm and dry Psychiatric: Normal mood and affect   RESULTS  Summary of this visit's results, reviewed and interpreted by myself:   EKG Interpretation  Date/Time:    Ventricular Rate:    PR Interval:    QRS Duration:   QT Interval:    QTC Calculation:   R Axis:     Text Interpretation:        Laboratory Studies: Results for orders placed or performed during the hospital encounter of 02/18/21 (from the past 24 hour(s))  CBC with Differential/Platelet     Status: None   Collection Time: 02/18/21  3:52 AM  Result Value Ref Range   WBC 4.8 4.0 - 10.5 K/uL   RBC 4.55 3.87 - 5.11 MIL/uL   Hemoglobin 12.5 12.0 - 15.0 g/dL   HCT 16.1 09.6 - 04.5 %   MCV 84.8 80.0 - 100.0 fL   MCH 27.5 26.0 - 34.0 pg   MCHC 32.4 30.0 - 36.0 g/dL   RDW 40.9 81.1 - 91.4 %   Platelets 281 150 - 400 K/uL   nRBC 0.0 0.0 - 0.2 %   Neutrophils Relative % 41 %   Neutro Abs 1.9 1.7 - 7.7 K/uL   Lymphocytes Relative 43 %   Lymphs Abs 2.1 0.7 - 4.0 K/uL   Monocytes Relative 12 %   Monocytes Absolute 0.6 0.1 - 1.0 K/uL   Eosinophils Relative 3 %   Eosinophils Absolute 0.1 0.0 - 0.5 K/uL   Basophils Relative 1 %   Basophils Absolute 0.0 0.0 - 0.1 K/uL   Immature Granulocytes 0 %   Abs Immature Granulocytes 0.01 0.00 - 0.07 K/uL  Basic metabolic panel     Status: Abnormal   Collection Time: 02/18/21  3:52 AM  Result Value  Ref Range   Sodium 136 135 - 145 mmol/L   Potassium 3.4 (L) 3.5 - 5.1 mmol/L   Chloride 103 98 - 111 mmol/L   CO2 27 22 - 32 mmol/L   Glucose, Bld 107 (H) 70 - 99 mg/dL   BUN 15 6 - 20 mg/dL   Creatinine, Ser 7.82 0.44 - 1.00 mg/dL   Calcium 9.0 8.9 - 95.6 mg/dL   GFR, Estimated >21 >30 mL/min   Anion gap 6 5 - 15  Magnesium     Status: None   Collection Time: 02/18/21  3:52  AM  Result Value Ref Range   Magnesium 1.9 1.7 - 2.4 mg/dL   Imaging Studies: CT Angio Head W or Wo Contrast  Result Date: 02/18/2021 CLINICAL DATA:  Intermittent feelings of full body numbness and tingling. EXAM: CT ANGIOGRAPHY HEAD AND NECK TECHNIQUE: Multidetector CT imaging of the head and neck was performed using the standard protocol during bolus administration of intravenous contrast. Multiplanar CT image reconstructions and MIPs were obtained to evaluate the vascular anatomy. Carotid stenosis measurements (when applicable) are obtained utilizing NASCET criteria, using the distal internal carotid diameter as the denominator. CONTRAST:  100mL OMNIPAQUE IOHEXOL 350 MG/ML SOLN COMPARISON:  Head CT from earlier today FINDINGS: CTA NECK FINDINGS Aortic arch: Normal.  Three vessel branching. Right carotid system: Vessels are smooth and widely patent. No atheromatous changes. Left carotid system: Vessels are smooth and widely patent. Minor atheromatous plaque at the bifurcation. Vertebral arteries: No proximal subclavian stenosis. Codominant vertebral arteries that are smoothly contoured and widely patent to the dura. Skeleton: Bulky ventral spondylitic spurring throughout the cervical spine that is out of proportion to disc space narrowing. Other neck: Hypertrophic tonsils.  No acute finding. Upper chest: Negative Review of the MIP images confirms the above findings CTA HEAD FINDINGS Anterior circulation: Vessels are smooth and widely patent. Negative for aneurysm or vascular malformation. Posterior circulation: The vertebral  and basilar arteries are smooth and widely patent. Proximal basilar fenestration. No branch occlusion, beading, or aneurysm. Venous sinuses: Diffusely patent Anatomic variants: None significant Review of the MIP images confirms the above findings IMPRESSION: 1. No emergent finding or flow limiting stenosis. 2. Very early atherosclerosis seen at the left carotid. 3. Diffuse idiopathic skeletal hyperostosis. Electronically Signed   By: Marnee SpringJonathon  Watts M.D.   On: 02/18/2021 06:36   CT Head Wo Contrast  Result Date: 02/18/2021 CLINICAL DATA:  Headache, left-sided tingling and numbness EXAM: CT HEAD WITHOUT CONTRAST TECHNIQUE: Contiguous axial images were obtained from the base of the skull through the vertex without intravenous contrast. COMPARISON:  None. FINDINGS: Brain: No evidence of acute infarction, hemorrhage, hydrocephalus, extra-axial collection, visible mass lesion or mass effect. Vascular: No hyperdense vessel or unexpected calcification. Skull: No calvarial fracture or suspicious osseous lesion. No scalp swelling or hematoma. Sinuses/Orbits: Paranasal sinuses and mastoid air cells are predominantly clear. Included orbital structures are unremarkable. Other: Extensive debris in the bilateral external auditory canals. IMPRESSION: 1. No acute intracranial abnormality. 2. Extensive debris in the bilateral external auditory canals, correlate for cerumen impaction. Electronically Signed   By: Kreg ShropshirePrice  DeHay M.D.   On: 02/18/2021 04:31   CT Angio Neck W and/or Wo Contrast  Result Date: 02/18/2021 CLINICAL DATA:  Intermittent feelings of full body numbness and tingling. EXAM: CT ANGIOGRAPHY HEAD AND NECK TECHNIQUE: Multidetector CT imaging of the head and neck was performed using the standard protocol during bolus administration of intravenous contrast. Multiplanar CT image reconstructions and MIPs were obtained to evaluate the vascular anatomy. Carotid stenosis measurements (when applicable) are obtained  utilizing NASCET criteria, using the distal internal carotid diameter as the denominator. CONTRAST:  100mL OMNIPAQUE IOHEXOL 350 MG/ML SOLN COMPARISON:  Head CT from earlier today FINDINGS: CTA NECK FINDINGS Aortic arch: Normal.  Three vessel branching. Right carotid system: Vessels are smooth and widely patent. No atheromatous changes. Left carotid system: Vessels are smooth and widely patent. Minor atheromatous plaque at the bifurcation. Vertebral arteries: No proximal subclavian stenosis. Codominant vertebral arteries that are smoothly contoured and widely patent to the dura. Skeleton: Bulky ventral spondylitic  spurring throughout the cervical spine that is out of proportion to disc space narrowing. Other neck: Hypertrophic tonsils.  No acute finding. Upper chest: Negative Review of the MIP images confirms the above findings CTA HEAD FINDINGS Anterior circulation: Vessels are smooth and widely patent. Negative for aneurysm or vascular malformation. Posterior circulation: The vertebral and basilar arteries are smooth and widely patent. Proximal basilar fenestration. No branch occlusion, beading, or aneurysm. Venous sinuses: Diffusely patent Anatomic variants: None significant Review of the MIP images confirms the above findings IMPRESSION: 1. No emergent finding or flow limiting stenosis. 2. Very early atherosclerosis seen at the left carotid. 3. Diffuse idiopathic skeletal hyperostosis. Electronically Signed   By: Marnee Spring M.D.   On: 02/18/2021 06:36    ED COURSE and MDM  Nursing notes, initial and subsequent vitals signs, including pulse oximetry, reviewed and interpreted by myself.  Vitals:   02/18/21 0500 02/18/21 0530 02/18/21 0630 02/18/21 0638  BP: (!) 142/78 139/83 (!) 157/91   Pulse: 72 71 66   Resp: 16 16 16    Temp:      TempSrc:      SpO2: 97% 99% 100%   Weight:    (!) 145.2 kg  Height:    5\' 7"  (1.702 m)   Medications  potassium chloride SA (KLOR-CON) CR tablet 40 mEq (40 mEq  Oral Given 02/18/21 0554)  iohexol (OMNIPAQUE) 350 MG/ML injection 100 mL (100 mLs Intravenous Contrast Given 02/18/21 0600)   6:53 AM Patient discussed with Dr. 02/20/21, neuro hospitalist.  She recommended we obtain a CT angio head and neck.  These were reassuring and the patient will be able to follow-up as an outpatient.  No evidence of a stroke or impending stroke.   PROCEDURES  Procedures   ED DIAGNOSES     ICD-10-CM   1. Paresthesia  R20.2        02/20/21, MD 02/18/21 [phone]

## 2021-02-27 ENCOUNTER — Other Ambulatory Visit: Payer: Self-pay

## 2021-02-27 ENCOUNTER — Ambulatory Visit (INDEPENDENT_AMBULATORY_CARE_PROVIDER_SITE_OTHER): Payer: Federal, State, Local not specified - PPO | Admitting: Cardiovascular Disease

## 2021-02-27 DIAGNOSIS — I10 Essential (primary) hypertension: Secondary | ICD-10-CM | POA: Diagnosis not present

## 2021-02-27 NOTE — Assessment & Plan Note (Signed)
Patient with essential hypertension, doing well on current regimen.  She admits to occasional bilateral lower extremity edema.  Will have her continue to monitor for that and try to determine if related to sodium intake.  Otherwise, she should continue with home BP monitoring and her current medications.  We will schedule her to follow up with Dr. Duke Salvia in June.  Patient given activation code for MyChart and will contact us with any further questions or concerns.

## 2021-02-27 NOTE — Patient Instructions (Signed)
Return for a a follow up appointment June 13 at 3:15 pm  Check your blood pressure at home daily and keep record of the readings.  Take your BP meds as follows:  Continue with all current medications  Bring all of your meds, your BP cuff and your record of home blood pressures to your next appointment.  Exercise as you're able, try to walk approximately 30 minutes per day.  Keep salt intake to a minimum, especially watch canned and prepared boxed foods.  Eat more fresh fruits and vegetables and fewer canned items.  Avoid eating in fast food restaurants.    HOW TO TAKE YOUR BLOOD PRESSURE: . Rest 5 minutes before taking your blood pressure. .  Don't smoke or drink caffeinated beverages for at least 30 minutes before. . Take your blood pressure before (not after) you eat. . Sit comfortably with your back supported and both feet on the floor (don't cross your legs). . Elevate your arm to heart level on a table or a desk. . Use the proper sized cuff. It should fit smoothly and snugly around your bare upper arm. There should be enough room to slip a fingertip under the cuff. The bottom edge of the cuff should be 1 inch above the crease of the elbow. . Ideally, take 3 measurements at one sitting and record the average.

## 2021-02-27 NOTE — Progress Notes (Signed)
02/27/2021 Susan Sutton 02-17-1971 [id number]   HPI:  Susan Sutton is a 50 y.o. female patient of Dr Duke Salvia, who presents today for advanced hypertension clinic follow up.   In addition to hypertension, her medical history is significant for morbid obesity, pre-diabetes and NSVT.  She was started on metoprolol in November, but notes not much change in her symptoms.  Her BP also continued to be elevated at 150-170's/80-90's.   She admits to using ibuprofen daily for chronic pain from sciatica.  Dr. Duke Salvia started her on amlodipine 5 mg daily and encouraged lifestyle modifications and was given information on PREP.    She returns today for follow up.  She had her first PREP class on Tuesday evening and is headed back tonight.  She is looking forward to this.  She notes that she developed some lower extremity edema after starting the amlodipine, but it has since gone away.  Otherwise no issues with the medication.  Also states that she does have occasional palpitations/flutters despite being on metoprolol, usually associated with her menstrual cycle.  She was scheduled to have a D&C earlier this month, but her employer refused to give her the day off.  She is currently trying to reschedule, but running into roadblocks with employer denying suggested dates.      At her last visit, in February, her pressure was slightly elevated at 134/82.  Because metoprolol is a weak BP medication, and her palpitations are infrequent, we cut that back to 25 mg daily and added chlorthalidone 12.5 mg daily.   Since her last visit she was able to have her D&C and is feeling much better.  Did have ED visit recently for tingling sensation that would run from her head down into her torso.  CT of head and neck were negative.  She reports the sensations are still randomly occurring.    Blood Pressure Goal:  130/80  Current Medications: amlodipine 5 mg qd, metoprolol succ 25 mg qd, chlorthalidone 12.5 mg qd  Family Hx:  mother's family has strong history of heart disease - mother died at 10 from MI, also had DM, amputation; maternal grandmother and aunts with cardiac issues, maternal cousin died from MI at 84, another has AF; father had hypertension, DM, died from drug abuse issues, more cancers in his family.     Social Hx: no tobacco, rare alcohol, coffee 12-14oz daily, trying to cut back, drinking less sweet tea (now 1 gal twice weekly instead of most days)  Diet: some eating out but not much, has recently switched to using salt while cooking as opposed to at the table,  not as much veggies as would like - she is single, hard to cook vegetables for 1  Exercise: started PREP Tuesday with information, next class tonight  Home BP readings: brings in readings from the past 2 months -   22 AM readings average 131/82  16 PM readings average 137/83  Intolerances: previously had edema with amlodipine, tolerating well now  Labs: 1/22: Na 139, K 4.5, Glu 97, BUN 9, SCr 0.75, GFR 109  4/22: Na 136, K 3.4, Glu 107, BUN 15, SCR 0.95, GFR > 60  Wt Readings from Last 3 Encounters:  02/27/21 (!) 327 lb (148.3 kg)  02/18/21 (!) 320 lb 1.7 oz (145.2 kg)  01/19/21 (!) 320 lb (145.2 kg)   BP Readings from Last 3 Encounters:  02/27/21 (!) 150/74  02/18/21 (!) 157/91  01/19/21 (!) 162/88   Pulse Readings from  Last 3 Encounters:  02/27/21 81  02/18/21 66  01/19/21 79    Current Outpatient Medications  Medication Sig Dispense Refill  . acetaminophen (TYLENOL) 650 MG CR tablet Take 650 mg by mouth every 8 (eight) hours as needed for pain.    Marland Kitchen amLODipine (NORVASC) 5 MG tablet Take 1 tablet (5 mg total) by mouth daily. (Patient taking differently: Take 5 mg by mouth at bedtime.) 90 tablet 3  . chlorthalidone (HYGROTON) 25 MG tablet Take 0.5 tablets (12.5 mg total) by mouth daily. (Patient taking differently: Take 12.5 mg by mouth at bedtime.) 15 tablet 3  . ibuprofen (ADVIL,MOTRIN) 200 MG tablet Take 4 tablets (800  mg total) by mouth 3 (three) times daily. (Patient taking differently: Take 600-800 mg by mouth daily as needed for mild pain or moderate pain.) 90 tablet 1  . metoprolol succinate (TOPROL-XL) 50 MG 24 hr tablet Take 0.5 tablets (25 mg total) by mouth daily. Take with or immediately following a meal. (Patient taking differently: Take 25 mg by mouth at bedtime. Take with or immediately following a meal.) 90 tablet 3   No current facility-administered medications for this visit.    No Known Allergies  Past Medical History:  Diagnosis Date  . Angina pectoris (HCC) 11/23/2020  . Essential hypertension 11/23/2020  . Hepatitis    "b"   . Morbid obesity (HCC) 11/23/2020  . Pre-diabetes    diet controlled - no meds- does not check blood sugar    Blood pressure (!) 150/74, pulse 81, resp. rate 17, height 5\' 7"  (1.702 m), weight (!) 327 lb (148.3 kg), last menstrual period 11/29/2020, SpO2 98 %.  Essential hypertension Patient with essential hypertension, doing well on current regimen.  She admits to occasional bilateral lower extremity edema.  Will have her continue to monitor for that and try to determine if related to sodium intake.  Otherwise, she should continue with home BP monitoring and her current medications.  We will schedule her to follow up with Dr. 12/01/2020 in June.  Patient given activation code for MyChart and will contact July with any further questions or concerns.    [place] PharmD CPP [facility] [address] [place], [place] [phone]

## 2021-03-23 NOTE — Progress Notes (Signed)
[facility] PREP Progress Report   Patient Details  Name: Susan Sutton MRN: [id number] Date of Birth: 04/08/1971 Age: 50 y.o. PCP: Diamantina Providence, FNP  Vitals:   03/22/21 1730  BP: 132/80  Pulse: 88  SpO2: 96%  Weight: (!) 327 lb 6.4 oz (148.5 kg)  Height: 5\' 7"  (1.702 m)      [facility] Eval - 03/23/21 1200      Referral    Referring Provider Reattempting PREP   03/25/21   Reason for referral Hypertension;Obesitity/Overweight;Inactivity    Program Start Date 03/27/21   T/TH 615p-730p x 12 wks     Measurement   Waist Circumference 51 inches    Hip Circumference 58.5 inches    Body fat --   too high to measure     Information for Trainer   Goals develop consistent habit change, daily exercise, meal planning, walking 30 min every day    Current Exercise none    Orthopedic Concerns sciatica, both knees ? meniscal tear, low back pain   encouraged not to sit for more than 30 min at a time   Pertinent Medical History HTN, prediabetic    Current Barriers herself    Medications that affect exercise Beta blocker;Medication causing dizziness/drowsiness      Timed Up and Go (TUGS)   Timed Up and Go Low risk <9 seconds      Mobility and Daily Activities   I find it easy to walk up or down two or more flights of stairs. 4    I have no trouble taking out the trash. 4    I do housework such as vacuuming and dusting on my own without difficulty. 4    I can easily lift a gallon of milk (8lbs). 4    I can easily walk a mile. 4    I have no trouble reaching into high cupboards or reaching down to pick up something from the floor. 2    I do not have trouble doing out-door work such as 03/29/21, raking leaves, or gardening. 4      Mobility and Daily Activities   I feel younger than my age. 2    I feel independent. 4    I feel energetic. 4    I live an active life.  2    I feel strong. 4    I feel healthy. 3    I feel active as other people my age. 2      How fit and strong  are you.   Fit and Strong Total Score 47          Past Medical History:  Diagnosis Date  . Angina pectoris (HCC) 11/23/2020  . Essential hypertension 11/23/2020  . Hepatitis    "b"   . Morbid obesity (HCC) 11/23/2020  . Pre-diabetes    diet controlled - no meds- does not check blood sugar   Past Surgical History:  Procedure Laterality Date  . DILATATION & CURRETTAGE/HYSTEROSCOPY WITH RESECTOCOPE N/A 01/19/2021   Procedure: DILATATION & CURETTAGE/HYSTEROSCOPY WITH MYOSURE;  Surgeon: 01/21/2021, MD;  Location: [facility] OR;  Service: Gynecology;  Laterality: N/A;  . KNEE ARTHROSCOPY Right 08/22/2014   Procedure: ARTHROSCOPY KNEE;  Surgeon: 08/24/2014, MD;  Location: [facility] OR;  Service: Orthopedics;  Laterality: Right;  Partial medial menisectomy  . WISDOM TOOTH EXTRACTION     Social History   Tobacco Use  Smoking Status Never Smoker  Smokeless Tobacco Never Used  Bonnye Fava 03/23/2021, 12:41 PM

## 2021-03-30 ENCOUNTER — Telehealth: Payer: Self-pay

## 2021-03-30 DIAGNOSIS — Z006 Encounter for examination for normal comparison and control in clinical research program: Secondary | ICD-10-CM

## 2021-03-30 NOTE — Telephone Encounter (Signed)
I have attempted without success to contact this patient by phone for her Identify 90 day follow up phone call. I left a message for patient to return my phone call with my name and callback number. An e-mail was also sent to patient.  

## 2021-04-10 ENCOUNTER — Telehealth: Payer: Self-pay

## 2021-04-10 DIAGNOSIS — Z006 Encounter for examination for normal comparison and control in clinical research program: Secondary | ICD-10-CM

## 2021-04-10 NOTE — Telephone Encounter (Signed)
I called patient for her 90-day Identify Study follow up phone call. Patient is doing well with no cardiac symptoms at this time. I reminded patient I would call her in February for her 1 year follow-up. 

## 2021-04-16 ENCOUNTER — Ambulatory Visit: Payer: Federal, State, Local not specified - PPO | Admitting: Cardiovascular Disease

## 2021-04-18 NOTE — Progress Notes (Signed)
[facility] PREP Weekly Session   Patient Details  Name: Susan Sutton MRN: [id number] Date of Birth: 1971/06/16 Age: 50 y.o. PCP: Diamantina Providence, FNP  There were no vitals filed for this visit.   [facility] Weekly seesion - 04/18/21 1100       Weekly Session   Topic Discussed Health habits;Water    Minutes exercised this week --   did not record   Classes attended to date 5            Class held at [facility]  Reminded to hand in weekly progress notes   Bonnye Fava 04/18/2021, 11:44 AM

## 2021-04-27 ENCOUNTER — Other Ambulatory Visit: Payer: Self-pay | Admitting: Cardiovascular Disease

## 2021-05-02 NOTE — Progress Notes (Signed)
[facility] PREP Weekly Session   Patient Details  Name: Susan Sutton MRN: [id number] Date of Birth: [birth year] Age: 50 y.o. PCP: Diamantina Providence, FNP  There were no vitals filed for this visit.   [employer] Weekly seesion - 05/02/21 1000       Weekly Session   Topic Discussed Stress management and problem solving   Chair yoga, meditation, importance of sleep   Minutes exercised this week 120 minutes    Classes attended to date 7           Class held at [facility] on 05/01/21 at 615pm   [facility] 05/02/2021, 10:07 AM

## 2021-05-23 NOTE — Progress Notes (Signed)
[facility] PREP Weekly Session   Patient Details  Name: Susan Sutton MRN: [id number] Date of Birth: [birth year] Age: 50 y.o. PCP: Diamantina Providence, FNP  Vitals:     [employer] Weekly seesion - 05/23/21 1000       Weekly Session   Topic Discussed Finding support    Minutes exercised this week 65 minutes   coached to be closer to 75 at this point   Classes attended to date 10            Class at [facility] Brainstormed other items for breakfast   Lawernce Ion Tequlia Gonsalves 05/23/2021, 10:30 AM

## 2021-05-27 ENCOUNTER — Other Ambulatory Visit: Payer: Self-pay | Admitting: Cardiovascular Disease

## 2021-06-26 ENCOUNTER — Other Ambulatory Visit: Payer: Self-pay | Admitting: Cardiovascular Disease

## 2021-07-04 NOTE — Progress Notes (Signed)
PREP class 03/27/21 to 06/14/21 Completed > 13 works outs  Attended 4 of the 12 educational sessions Did not schedule for final measurements

## 2021-07-10 NOTE — Progress Notes (Signed)
[facility] PREP Progress Report   Patient Details  Name: Susan Sutton MRN: [id number] Date of Birth: 13-Oct-1971 Age: 50 y.o. PCP: Diamantina Providence, FNP  Vitals:   07/05/21 1800  BP: 138/78  Pulse: 79  SpO2: 97%  Weight: (!) 324 lb 12.8 oz (147.3 kg)      [facility] Eval - 07/10/21 1100       Referral    Referring Provider [facility] Date --   PREP class 5/24 to 8/11     Measurement   Waist Circumference 50 inches    Hip Circumference 57 inches    Body fat --   too high to measure     Information for Trainer   Goals --   goal to walk most days     Mobility and Daily Activities   I find it easy to walk up or down two or more flights of stairs. 2    I have no trouble taking out the trash. 4    I do housework such as vacuuming and dusting on my own without difficulty. 4    I can easily lift a gallon of milk (8lbs). 4    I can easily walk a mile. 4    I have no trouble reaching into high cupboards or reaching down to pick up something from the floor. 4    I do not have trouble doing out-door work such as Loss adjuster, chartered, raking leaves, or gardening. 4      Mobility and Daily Activities   I feel younger than my age. 2    I feel independent. 4    I feel energetic. 4    I live an active life.  4    I feel strong. 4    I feel healthy. 3    I feel active as other people my age. 4      How fit and strong are you.   Fit and Strong Total Score 51            Past Medical History:  Diagnosis Date   Angina pectoris (HCC) 11/23/2020   Essential hypertension 11/23/2020   Hepatitis    "b"    Morbid obesity (HCC) 11/23/2020   Pre-diabetes    diet controlled - no meds- does not check blood sugar   Past Surgical History:  Procedure Laterality Date   DILATATION & CURRETTAGE/HYSTEROSCOPY WITH RESECTOCOPE N/A 01/19/2021   Procedure: DILATATION & CURETTAGE/HYSTEROSCOPY WITH MYOSURE;  Surgeon: Maxie Better, MD;  Location: [facility] OR;  Service: Gynecology;   Laterality: N/A;   KNEE ARTHROSCOPY Right 08/22/2014   Procedure: ARTHROSCOPY KNEE;  Surgeon: Dannielle Huh, MD;  Location: [facility] OR;  Service: Orthopedics;  Laterality: Right;  Partial medial menisectomy   WISDOM TOOTH EXTRACTION     Social History   Tobacco Use  Smoking Status Never  Smokeless Tobacco Never   Classes held at [facility] test 07/05/21 at [facility]  Improvements in cardio and strength March test: 215 to 267 Sit to stand: 10 to 14  Bicep curl: 23 to 18  Improvement in single leg balance work from 3 to 1. Encouraged to continue to exercise and work on balance Attended > 13 work outs and 4 of 12 educational sessions.   Bonnye Fava 07/10/2021, 11:58 AM

## 2021-07-12 NOTE — Progress Notes (Signed)
Advanced Hypertension Follow-Up   Date:  07/13/2021   ID:  Susan Sutton, DOB 1971-03-11, MRN [id number]  PCP:  Diamantina Providence, FNP  Cardiologist:  None  Nephrologist:  Referring MD: Diamantina Providence, FNP   CC: Hypertension  History of Present Illness:    Susan Sutton is a 50 y.o. female with hypertension, morbid obesity, NSVT, and prediabetes here today for advanced hypertension follow-up. Was initially seen 1/22 for hypertension. She has been working form home due to the pandemic. She saw Dr. Cherly Hensen on 09/2020 and her BP was 160/88.  She has gained a lot of weight since working from home.  Ms. Beem previously saw cardiologist through [facility] for palpitations.  She wore an ambulatory monitor that showed 4 runs of NSVT with rare PVCs and PACs.  She was started on metoprolol 09/2020.  She did not notice much change in her symptoms.  Despite this her blood pressure has continued to be elevated.  At home it has been running in the 150s to 170s over 80s to 90s.  She mostly cooks at home.  She is using air Eunice Blase and switch to [employer] because she heard this was better.  She drinks 1 to 212 ounce coffees daily.  She was drinking up to a gallon of sweet tea.  She is adding Trulicity to her coffee instead of sugar.  She is trying to slowly cut back on how much tea she drinks.  She does snore and has some daytime somnolence, though she attributes this to her job being boring.  She has chronic pain from sciatica and takes ibuprofen at least once or twice daily for either her back pain or menstrual cramps.  She notes a strong family history of heart disease in the women in her family. She started on Amlodipine but developed swelling but switched to Chlorthalidone. She enrolled in the PREP program and really enjoyed it.   Today, patient feels very good. Her blood pressure at home ranges systolic 110s-130s and diastolic 70s-80s. The first 12 weeks of PREP program she had to drop out due to issues in  life however she joined the program recently and lost 3-5 lbs. She is considering weight lose surgery but she wants to try exercise and eat correctly first. She knows she doesn't do well staying consistent with exercising and diet because it feels like work and overwhelms her. She walks her dog everyday and tries to walk by herself twice a week but haven't recently due to working a lot and caring for aunt with chemo. She likes a lot of fried foods and tea however has been drinking a gallon of tea a week instead of every other day and has cut out sugar in her coffee. She also reported her friends noticed she snores when sleeping. Her friends stated the snoring wasn't out of control and she was still breathing but she does feel well rested when she sleeps.    Past Medical History:  Diagnosis Date   Angina pectoris (HCC) 11/23/2020   Essential hypertension 11/23/2020   Hepatitis    "b"    Morbid obesity (HCC) 11/23/2020   Pre-diabetes    diet controlled - no meds- does not check blood sugar   Snoring 07/13/2021    Past Surgical History:  Procedure Laterality Date   DILATATION & CURRETTAGE/HYSTEROSCOPY WITH RESECTOCOPE N/A 01/19/2021   Procedure: DILATATION & CURETTAGE/HYSTEROSCOPY WITH MYOSURE;  Surgeon: Maxie Better, MD;  Location: [facility] OR;  Service: Gynecology;  Laterality: N/A;  KNEE ARTHROSCOPY Right 08/22/2014   Procedure: ARTHROSCOPY KNEE;  Surgeon: Dannielle Huh, MD;  Location: [facility] OR;  Service: Orthopedics;  Laterality: Right;  Partial medial menisectomy   WISDOM TOOTH EXTRACTION      Current Medications: Current Meds  Medication Sig   acetaminophen (TYLENOL) 650 MG CR tablet Take 650 mg by mouth every 8 (eight) hours as needed for pain.   amLODipine (NORVASC) 5 MG tablet Take 1 tablet (5 mg total) by mouth daily. (Patient taking differently: Take 5 mg by mouth at bedtime.)   chlorthalidone (HYGROTON) 25 MG tablet Take 1/2 (one-half) tablet by mouth once daily   [DISCONTINUED]  Semaglutide, 1 MG/DOSE, (OZEMPIC, 1 MG/DOSE,) 4 MG/3ML SOPN Inject 1 mg into the skin once a week. FOR 4 WEEKS AND THEN INCREASE TO 2 MG WEEKLY   [DISCONTINUED] Semaglutide,0.25 or 0.5MG /DOS, (OZEMPIC, 0.25 OR 0.5 MG/DOSE,) 2 MG/1.5ML SOPN INJECT 0.25 MG WEEKLY FOR 4 WEEKS THEN INCREASE 0.5 MG WEEKLY FOR 4 WEEKS     Allergies:   Patient has no known allergies.   Social History   Socioeconomic History   Marital status: Single    Spouse name: Not on file   Number of children: Not on file   Years of education: Not on file   Highest education level: Not on file  Occupational History   Not on file  Tobacco Use   Smoking status: Never   Smokeless tobacco: Never  Vaping Use   Vaping Use: Never used  Substance and Sexual Activity   Alcohol use: Yes    Comment: occ   Drug use: No   Sexual activity: Yes    Birth control/protection: Condom  Other Topics Concern   Not on file  Social History Narrative   Not on file   Social Determinants of Health   Financial Resource Strain: Low Risk    Difficulty of Paying Living Expenses: Not hard at all  Food Insecurity: No Food Insecurity   Worried About Programme researcher, broadcasting/film/video in the Last Year: Never true   Ran Out of Food in the Last Year: Never true  Transportation Needs: No Transportation Needs   Lack of Transportation (Medical): No   Lack of Transportation (Non-Medical): No  Physical Activity: Insufficiently Active   Days of Exercise per Week: 2 days   Minutes of Exercise per Session: 30 min  Stress: Not on file  Social Connections: Not on file     Family History: The patient's family history includes Alzheimer's disease in her maternal grandmother; Breast cancer in her paternal aunt and paternal grandmother; Colon cancer in her paternal uncle; Colon polyps in her sister; Congestive Heart Failure in her father and mother; Diabetes in her father, maternal grandmother, mother, and sister; Epilepsy in her maternal grandmother; Heart attack in  her father and mother; Hyperlipidemia in her sister; Hypertension in her brother, father, maternal grandmother, mother, and sister; Lupus in her sister; Stroke in her mother.  ROS:   Please see the history of present illness.      EKGs/Labs/Other Studies Reviewed:    EKG:  9/22: Sinus rhythm  71 bpm.  1/22:Sinus rhythm.  Rate 73 bpm.  Recent Labs: 11/23/2020: ALT 11; TSH 2.110 02/18/2021: BUN 15; Creatinine, Ser 0.95; Hemoglobin 12.5; Magnesium 1.9; Platelets 281; Potassium 3.4; Sodium 136   07/05/19: Total cholesterol 151, triglycerides 62, HDL 51, LDL 87  Recent Lipid Panel    Component Value Date/Time   CHOL 146 01/23/2015 0000   TRIG 61 01/23/2015 0000  HDL 50 01/23/2015 0000   LDLCALC 84 01/23/2015 0000    Physical Exam:    VS:  BP 136/70 (BP Location: Left Arm, Patient Position: Sitting)   Pulse 71   Ht 5\' 7"  (1.702 m)   Wt (!) 321 lb (145.6 kg)   BMI 50.28 kg/m  , BMI Body mass index is 50.28 kg/m. GENERAL:  Well appearing HEENT: Pupils equal round and reactive, fundi not visualized, oral mucosa unremarkable NECK:  No jugular venous distention, waveform within normal limits, carotid upstroke brisk and symmetric, no bruits LUNGS:  Clear to auscultation bilaterally HEART:  RRR.  PMI not displaced or sustained,S1 and S2 within normal limits, no S3, no S4, no clicks, no rubs, no murmurs ABD:  Flat, positive bowel sounds normal in frequency in pitch, no bruits, no rebound, no guarding, no midline pulsatile mass, no hepatomegaly, no splenomegaly EXT:  2 plus pulses throughout, no edema, no cyanosis no clubbing SKIN:  No rashes no nodules NEURO:  Cranial nerves II through XII grossly intact, motor grossly intact throughout PSYCH:  Cognitively intact, oriented to person place and time  ASSESSMENT:    1. Daytime somnolence   2. Essential hypertension   3. Morbid obesity (HCC)   4. Snoring      PLAN:    Essential hypertension BP averaging 120s/70s.  Continue  current regimen of amlodipine, chlorthalidone, and metoprolol.  Morbid obesity (HCC) She is working on lifestyle changes and weight loss.  She has a goal of losing 40 lb by 50 years old.  She was referred to the [facility] program.  She will consider Ozempic.  She has contemplated getting gastric bypass but wants to keep working on diet and exercise by now.  Snoring She was told by her friends that she snores loudly and she also has some daytime somnolence.  We will get a sleep study.   Disposition:    FU with Noha Milberger C. 45, MD, [facility] in 6 months   Medication Adjustments/Labs and Tests Ordered: Current medicines are reviewed at length with the patient today.  Concerns regarding medicines are outlined above.  Orders Placed This Encounter  Procedures   Ambulatory referral to Family Practice   EKG 12-Lead   Itamar Sleep Study    Meds ordered this encounter  Medications   DISCONTD: Semaglutide,0.25 or 0.5MG /DOS, (OZEMPIC, 0.25 OR 0.5 MG/DOSE,) 2 MG/1.5ML SOPN    Sig: INJECT 0.25 MG WEEKLY FOR 4 WEEKS THEN INCREASE 0.5 MG WEEKLY FOR 4 WEEKS    Dispense:  1.5 mL    Refill:  1   DISCONTD: Semaglutide, 1 MG/DOSE, (OZEMPIC, 1 MG/DOSE,) 4 MG/3ML SOPN    Sig: Inject 1 mg into the skin once a week. FOR 4 WEEKS AND THEN INCREASE TO 2 MG WEEKLY    Dispense:  3 mL    Refill:  3     I,Jada Bradford,acting as a scribe for [facility], MD.,have documented all relevant documentation on the behalf of Chilton Si, MD,as directed by  Chilton Si, MD while in the presence of Chilton Si, MD.  I, Guerin Lashomb C. Chilton Si, MD have reviewed all documentation for this visit.  The documentation of the exam, diagnosis, procedures, and orders on 07/13/2021 are all accurate and complete.   Signed, 09/12/2021, MD  07/13/2021 11:11 AM    [facility]

## 2021-07-13 ENCOUNTER — Ambulatory Visit (INDEPENDENT_AMBULATORY_CARE_PROVIDER_SITE_OTHER): Payer: Federal, State, Local not specified - PPO | Admitting: Cardiovascular Disease

## 2021-07-13 ENCOUNTER — Other Ambulatory Visit: Payer: Self-pay

## 2021-07-13 ENCOUNTER — Encounter (HOSPITAL_BASED_OUTPATIENT_CLINIC_OR_DEPARTMENT_OTHER): Payer: Self-pay | Admitting: Cardiovascular Disease

## 2021-07-13 VITALS — BP 136/70 | HR 71 | Ht 67.0 in | Wt 321.0 lb

## 2021-07-13 DIAGNOSIS — R400 Somnolence: Secondary | ICD-10-CM

## 2021-07-13 DIAGNOSIS — G4733 Obstructive sleep apnea (adult) (pediatric): Secondary | ICD-10-CM

## 2021-07-13 DIAGNOSIS — R0683 Snoring: Secondary | ICD-10-CM | POA: Diagnosis not present

## 2021-07-13 DIAGNOSIS — I10 Essential (primary) hypertension: Secondary | ICD-10-CM | POA: Diagnosis not present

## 2021-07-13 HISTORY — DX: Snoring: R06.83

## 2021-07-13 HISTORY — DX: Obstructive sleep apnea (adult) (pediatric): G47.33

## 2021-07-13 MED ORDER — OZEMPIC (0.25 OR 0.5 MG/DOSE) 2 MG/1.5ML ~~LOC~~ SOPN: PEN_INJECTOR | SUBCUTANEOUS | 1 refills | Status: DC

## 2021-07-13 MED ORDER — OZEMPIC (1 MG/DOSE) 4 MG/3ML ~~LOC~~ SOPN: 1.0000 mg | PEN_INJECTOR | SUBCUTANEOUS | 3 refills | Status: DC

## 2021-07-13 NOTE — Assessment & Plan Note (Signed)
She was told by her friends that she snores loudly and she also has some daytime somnolence.  We will get a sleep study.

## 2021-07-13 NOTE — Patient Instructions (Addendum)
Medication Instructions:  Your physician recommends that you continue on your current medications as directed. Please refer to the Current Medication list given to you today  *If you need a refill on your cardiac medications before your next appointment, please call your pharmacy*  Lab Work: NONE   Testing/Procedures: HOME SLEEP STUDY YOU WERE GIVEN TODAY  ONCE YOUR INSURANCE HAS APPROVED THE OFFICE WILL CALL YOU WITH CODE TO START   Follow-Up: At [facility], you and your health needs are our priority.  As part of our continuing mission to provide you with exceptional heart care, we have created designated Provider Care Teams.  These Care Teams include your primary Cardiologist (physician) and Advanced Practice Providers (APPs -  Physician Assistants and Nurse Practitioners) who all work together to provide you with the care you need, when you need it.  We recommend signing up for the patient portal called "MyChart".  Sign up information is provided on this After Visit Summary.  MyChart is used to connect with patients for Virtual Visits (Telemedicine).  Patients are able to view lab/test results, encounter notes, upcoming appointments, etc.  Non-urgent messages can be sent to your provider as well.   To learn more about what you can do with MyChart, go to [email or web address].    Your next appointment:   3 month(s)  The format for your next appointment:   In Person  Provider:   Chilton Si, MD or Gillian Shields, NP  You have been referred to   Where: [facility] Address: [address] [place] [address] Phone: [phone]  IF YOU DO NOT HEAR FROM THE OFFICE IN 2 WEEKS YOU CAN CALL THEM DIRECTLY TO SCHEDULE   CONSIDER OZEMPIC

## 2021-07-13 NOTE — Assessment & Plan Note (Signed)
BP averaging 120s/70s.  Continue current regimen of amlodipine, chlorthalidone, and metoprolol.

## 2021-07-13 NOTE — Assessment & Plan Note (Signed)
She is working on lifestyle changes and weight loss.  She has a goal of losing 30 lb by 50 years old.  She was referred to the [facility] program.  She will consider Ozempic.  She has contemplated getting gastric bypass but wants to keep working on diet and exercise by now.

## 2021-07-17 ENCOUNTER — Telehealth: Payer: Self-pay | Admitting: *Deleted

## 2021-07-17 NOTE — Telephone Encounter (Signed)
-----   Message from [facility] sent at 07/13/2021  1:12 PM EDT -----  ----- Message ----- From: Burnell Blanks, LPN Sent: 06/04/8298  10:36 AM EDT To: [facility] Pre Cert/Auth  HELLO ALL  Patient needs precert for Itamar  Thanks and happy Friday  Juliette Alcide

## 2021-07-17 NOTE — Telephone Encounter (Signed)
Per Arlyss Repress with federal [employer] today @ 1:35 pm no PA is required for itamar sleep test. Staff message sent  to both. Florina Ou and Regis Bill.

## 2021-07-19 ENCOUNTER — Telehealth (HOSPITAL_BASED_OUTPATIENT_CLINIC_OR_DEPARTMENT_OTHER): Payer: Self-pay | Admitting: *Deleted

## 2021-07-19 NOTE — Telephone Encounter (Signed)
PIN [id number]  Advised patient

## 2021-07-19 NOTE — Telephone Encounter (Signed)
-----   Message from Gaynelle Cage, CMA sent at 07/17/2021  1:33 PM EDT ----- Per patient's [employer] no PA is required. Ok to activate PIN. ----- Message ----- From: Florina Ou Sent: 07/13/2021   1:16 PM EDT To: [facility]   ----- Message ----- From: Burnell Blanks, LPN Sent: 11/09/1094  10:36 AM EDT To: [facility] Pre Cert/Auth  HELLO ALL  Patient needs precert for Susan Sutton  Thanks and happy Friday  Juliette Alcide

## 2021-08-20 ENCOUNTER — Other Ambulatory Visit: Payer: Self-pay | Admitting: Cardiovascular Disease

## 2021-08-26 ENCOUNTER — Other Ambulatory Visit: Payer: Self-pay | Admitting: Cardiovascular Disease

## 2021-10-16 ENCOUNTER — Encounter (HOSPITAL_BASED_OUTPATIENT_CLINIC_OR_DEPARTMENT_OTHER): Payer: Self-pay | Admitting: Cardiovascular Disease

## 2021-10-16 ENCOUNTER — Other Ambulatory Visit: Payer: Self-pay

## 2021-10-16 ENCOUNTER — Ambulatory Visit (INDEPENDENT_AMBULATORY_CARE_PROVIDER_SITE_OTHER): Payer: Federal, State, Local not specified - PPO | Admitting: Cardiovascular Disease

## 2021-10-16 VITALS — BP 121/79 | HR 77 | Ht 67.0 in | Wt 326.2 lb

## 2021-10-16 DIAGNOSIS — I10 Essential (primary) hypertension: Secondary | ICD-10-CM

## 2021-10-16 DIAGNOSIS — R0683 Snoring: Secondary | ICD-10-CM | POA: Diagnosis not present

## 2021-10-16 DIAGNOSIS — R7303 Prediabetes: Secondary | ICD-10-CM

## 2021-10-16 NOTE — Assessment & Plan Note (Addendum)
Susan Sutton has prediabetes and morbid obesity.  She is going to keep working on exercise.  We also discussed trying Ozempic and she is interested.  We will have her see our pharmacist to help her sure how to use it and to get approval from her insurance.  Check fasting lipids and a CMP today.

## 2021-10-16 NOTE — Assessment & Plan Note (Signed)
Blood pressure is much better controlled.  She is going to work on increasing her exercise.  The goal is at least 150 minutes weekly.  Continue amlodipine, chlorthalidone, and metoprolol.  She is interested in considering work-up for PCOS.  I mentioned that they may put her on spironolactone which could help with her acne if she does truly have this.  If they do this, would recommend switching chlorthalidone to spironolactone.  Continue to work on limiting sodium intake to 2500 mg daily or less.

## 2021-10-16 NOTE — Assessment & Plan Note (Signed)
Sleep study pending 

## 2021-10-16 NOTE — Patient Instructions (Addendum)
Medication Instructions:  Your physician recommends that you continue on your current medications as directed. Please refer to the Current Medication list given to you today.  *If you need a refill on your cardiac medications before your next appointment, please call your pharmacy*  Lab Work: LP/CMET/A1C TODAY   If you have labs (blood work) drawn today and your tests are completely normal, you will receive your results only by: MyChart Message (if you have MyChart) OR A paper copy in the mail If you have any lab test that is abnormal or we need to change your treatment, we will call you to review the results.  Testing/Procedures: NONE   Follow-Up: At [facility], you and your health needs are our priority.  As part of our continuing mission to provide you with exceptional heart care, we have created designated Provider Care Teams.  These Care Teams include your primary Cardiologist (physician) and Advanced Practice Providers (APPs -  Physician Assistants and Nurse Practitioners) who all work together to provide you with the care you need, when you need it.  We recommend signing up for the patient portal called "MyChart".  Sign up information is provided on this After Visit Summary.  MyChart is used to connect with patients for Virtual Visits (Telemedicine).  Patients are able to view lab/test results, encounter notes, upcoming appointments, etc.  Non-urgent messages can be sent to your provider as well.   To learn more about what you can do with MyChart, go to [email or web address].    Your next appointment:   6 month(s)  The format for your next appointment:   In Person  Provider:   Chilton Si, MD   CALL [facility] TO FOLLOW UP ON GETTING AN APPOINTMENT [phone]

## 2021-10-16 NOTE — Progress Notes (Signed)
Advanced Hypertension Follow-Up   Date:  10/16/2021   ID:  Susan Sutton, DOB 05/09/1971, MRN [id number]  PCP:  Diamantina Providence, FNP  Cardiologist:  None  Nephrologist:  Referring MD: Diamantina Providence, FNP   CC: Hypertension  History of Present Illness:    Susan Sutton is a 50 y.o. female with hypertension, morbid obesity, NSVT, and prediabetes here today for advanced hypertension follow-up. She was initially seen 1/22 for hypertension. She has been working form home due to the pandemic. She saw Dr. Cherly Hensen on 09/2020 and her BP was 160/88.  She has gained a lot of weight since working from home.  [name] saw cardiologist through [facility] for palpitations.  She wore an ambulatory monitor that showed 4 runs of NSVT with rare PVCs and PACs.  She was started on metoprolol 09/2020.  She did not notice much change in her symptoms.  Despite this her blood pressure has continued to be elevated.  At home it has been running in the 150s to 170s over 80s to 90s.  She mostly cooks at home.  She is using air Eunice Blase and switch to [employer] because she heard this was better.  She drinks 1 to 212 ounce coffees daily.  She was drinking up to a gallon of sweet tea.  She is adding Trulicity to her coffee instead of sugar.  She is trying to slowly cut back on how much tea she drinks.  She does snore and has some daytime somnolence, though she attributes this to her job being boring.  She has chronic pain from sciatica and takes ibuprofen at least once or twice daily for either her back pain or menstrual cramps.  She notes a strong family history of heart disease in the women in her family. She started on Amlodipine but developed swelling but switched to Chlorthalidone. She enrolled in the PREP program and really enjoyed it.   At her last appointment she was doing well and her blood pressures were better controlled.  She was contemplating weight loss surgery.  She was referred to healthy wellness and  was considering Ozempic.  She was also referred for sleep study, but it does not look like it has been completed yet.  Lately she has been feeling well. She isn't getting formal exercise but is active.  She denies any chest pain or shortness of breath.  She has no lower extremity edema, orthopnea, or PND.  She continues to struggle with her diet.  She has been more consistent with taking her medications on time.  Her blood pressures at home have been well-controlled.  She wonders if she may have PCOS.  She has many of the symptoms she is read about online, including painful facial acne.    Past Medical History:  Diagnosis Date   Angina pectoris (HCC) 11/23/2020   Essential hypertension 11/23/2020   Hepatitis    "b"    Morbid obesity (HCC) 11/23/2020   Pre-diabetes    diet controlled - no meds- does not check blood sugar   Snoring 07/13/2021    Past Surgical History:  Procedure Laterality Date   DILATATION & CURRETTAGE/HYSTEROSCOPY WITH RESECTOCOPE N/A 01/19/2021   Procedure: DILATATION & CURETTAGE/HYSTEROSCOPY WITH MYOSURE;  Surgeon: Maxie Better, MD;  Location: [facility] OR;  Service: Gynecology;  Laterality: N/A;   KNEE ARTHROSCOPY Right 08/22/2014   Procedure: ARTHROSCOPY KNEE;  Surgeon: Dannielle Huh, MD;  Location: [facility] OR;  Service: Orthopedics;  Laterality: Right;  Partial medial menisectomy  WISDOM TOOTH EXTRACTION      Current Medications: Current Meds  Medication Sig   acetaminophen (TYLENOL) 650 MG CR tablet Take 650 mg by mouth every 8 (eight) hours as needed for pain.   amLODipine (NORVASC) 5 MG tablet Take 1 tablet (5 mg total) by mouth daily. Please call the office and schedule an appointment   chlorthalidone (HYGROTON) 25 MG tablet Take 1/2 (one-half) tablet by mouth once daily   metoprolol succinate (TOPROL-XL) 50 MG 24 hr tablet Take 0.5 tablets (25 mg total) by mouth daily. Take with or immediately following a meal.     Allergies:   Patient has no known allergies.   Social  History   Socioeconomic History   Marital status: Single    Spouse name: Not on file   Number of children: Not on file   Years of education: Not on file   Highest education level: Not on file  Occupational History   Not on file  Tobacco Use   Smoking status: Never   Smokeless tobacco: Never  Vaping Use   Vaping Use: Never used  Substance and Sexual Activity   Alcohol use: Yes    Comment: occ   Drug use: No   Sexual activity: Yes    Birth control/protection: Condom  Other Topics Concern   Not on file  Social History Narrative   Not on file   Social Determinants of Health   Financial Resource Strain: Low Risk    Difficulty of Paying Living Expenses: Not hard at all  Food Insecurity: No Food Insecurity   Worried About Programme researcher, broadcasting/film/video in the Last Year: Never true   Ran Out of Food in the Last Year: Never true  Transportation Needs: No Transportation Needs   Lack of Transportation (Medical): No   Lack of Transportation (Non-Medical): No  Physical Activity: Insufficiently Active   Days of Exercise per Week: 2 days   Minutes of Exercise per Session: 30 min  Stress: Not on file  Social Connections: Not on file     Family History: The patient's family history includes Alzheimer's disease in her maternal grandmother; Breast cancer in her paternal aunt and paternal grandmother; Colon cancer in her paternal uncle; Colon polyps in her sister; Congestive Heart Failure in her father and mother; Diabetes in her father, maternal grandmother, mother, and sister; Epilepsy in her maternal grandmother; Heart attack in her father and mother; Hyperlipidemia in her sister; Hypertension in her brother, father, maternal grandmother, mother, and sister; Lupus in her sister; Stroke in her mother.  ROS:   Please see the history of present illness.      EKGs/Labs/Other Studies Reviewed:    EKG:  9/22: Sinus rhythm  71 bpm.  1/22:Sinus rhythm.  Rate 73 bpm.  Recent Labs: 11/23/2020: ALT  11; TSH 2.110 02/18/2021: BUN 15; Creatinine, Ser 0.95; Hemoglobin 12.5; Magnesium 1.9; Platelets 281; Potassium 3.4; Sodium 136   07/05/19: Total cholesterol 151, triglycerides 62, HDL 51, LDL 87  Recent Lipid Panel    Component Value Date/Time   CHOL 146 01/23/2015 0000   TRIG 61 01/23/2015 0000   HDL 50 01/23/2015 0000   LDLCALC 84 01/23/2015 0000    Physical Exam:    VS:  BP 121/79 (BP Location: Left Arm, Patient Position: Sitting, Cuff Size: Large)    Pulse 77    Ht 5\' 7"  (1.702 m)    Wt (!) 326 lb 3.2 oz (148 kg)    SpO2 97%    BMI  51.09 kg/m  , BMI Body mass index is 51.09 kg/m. GENERAL:  Well appearing HEENT: Pupils equal round and reactive, fundi not visualized, oral mucosa unremarkable NECK:  No jugular venous distention, waveform within normal limits, carotid upstroke brisk and symmetric, no bruits LUNGS:  Clear to auscultation bilaterally HEART:  RRR.  PMI not displaced or sustained,S1 and S2 within normal limits, no S3, no S4, no clicks, no rubs, no murmurs ABD:  Flat, positive bowel sounds normal in frequency in pitch, no bruits, no rebound, no guarding, no midline pulsatile mass, no hepatomegaly, no splenomegaly EXT:  2 plus pulses throughout, no edema, no cyanosis no clubbing SKIN:  No rashes no nodules NEURO:  Cranial nerves II through XII grossly intact, motor grossly intact throughout PSYCH:  Cognitively intact, oriented to person place and time  ASSESSMENT:    1. Essential hypertension   2. Morbid obesity (HCC)   3. Snoring       PLAN:    Essential hypertension Blood pressure is much better controlled.  She is going to work on increasing her exercise.  The goal is at least 150 minutes weekly.  Continue amlodipine, chlorthalidone, and metoprolol.  She is interested in considering work-up for PCOS.  I mentioned that they may put her on spironolactone which could help with her acne if she does truly have this.  If they do this, would recommend switching  chlorthalidone to spironolactone.  Continue to work on limiting sodium intake to 2500 mg daily or less.  Morbid obesity (HCC) Ms. Laurent has prediabetes and morbid obesity.  She is going to keep working on exercise.  We also discussed trying Ozempic and she is interested.  We will have her see our pharmacist to help her sure how to use it and to get approval from her insurance.  Check fasting lipids and a CMP today.  Snoring Sleep study pending.   Disposition:    FU with Dalilah Curlin C. Duke Salvia, MD, [facility] in 6 months   Medication Adjustments/Labs and Tests Ordered: Current medicines are reviewed at length with the patient today.  Concerns regarding medicines are outlined above.  No orders of the defined types were placed in this encounter.   No orders of the defined types were placed in this encounter.      Signed, Chilton Si, MD  10/16/2021 11:01 AM    [facility]

## 2021-10-17 LAB — COMPREHENSIVE METABOLIC PANEL
ALT: 16 IU/L (ref 0–32)
AST: 17 IU/L (ref 0–40)
Albumin/Globulin Ratio: 1.3 (ref 1.2–2.2)
Albumin: 3.9 g/dL (ref 3.8–4.8)
Alkaline Phosphatase: 150 IU/L — ABNORMAL HIGH (ref 44–121)
BUN/Creatinine Ratio: 13 (ref 9–23)
BUN: 13 mg/dL (ref 6–24)
Bilirubin Total: 0.4 mg/dL (ref 0.0–1.2)
CO2: 23 mmol/L (ref 20–29)
Calcium: 9.2 mg/dL (ref 8.7–10.2)
Chloride: 103 mmol/L (ref 96–106)
Creatinine, Ser: 1 mg/dL (ref 0.57–1.00)
Globulin, Total: 3 g/dL (ref 1.5–4.5)
Glucose: 113 mg/dL — ABNORMAL HIGH (ref 70–99)
Potassium: 3.8 mmol/L (ref 3.5–5.2)
Sodium: 141 mmol/L (ref 134–144)
Total Protein: 6.9 g/dL (ref 6.0–8.5)
eGFR: 69 mL/min/{1.73_m2} (ref >59)

## 2021-10-17 LAB — LIPID PANEL
Chol/HDL Ratio: 3.1 ratio (ref 0.0–4.4)
Cholesterol, Total: 159 mg/dL (ref 100–199)
HDL: 52 mg/dL (ref >39)
LDL Chol Calc (NIH): 95 mg/dL (ref 0–99)
Triglycerides: 59 mg/dL (ref 0–149)
VLDL Cholesterol Cal: 12 mg/dL (ref 5–40)

## 2021-10-17 LAB — HEMOGLOBIN A1C
Est. average glucose Bld gHb Est-mCnc: 143 mg/dL
Hgb A1c MFr Bld: 6.6 % — ABNORMAL HIGH (ref 4.8–5.6)

## 2021-10-24 DIAGNOSIS — R509 Fever, unspecified: Secondary | ICD-10-CM | POA: Diagnosis not present

## 2021-10-24 DIAGNOSIS — R5383 Other fatigue: Secondary | ICD-10-CM | POA: Diagnosis not present

## 2021-10-24 DIAGNOSIS — R051 Acute cough: Secondary | ICD-10-CM | POA: Diagnosis not present

## 2021-10-24 DIAGNOSIS — U071 COVID-19: Secondary | ICD-10-CM | POA: Diagnosis not present

## 2021-10-31 DIAGNOSIS — R051 Acute cough: Secondary | ICD-10-CM | POA: Diagnosis not present

## 2021-11-06 ENCOUNTER — Ambulatory Visit: Payer: Federal, State, Local not specified - PPO

## 2021-11-23 ENCOUNTER — Other Ambulatory Visit: Payer: Self-pay | Admitting: Cardiovascular Disease

## 2021-11-23 ENCOUNTER — Telehealth (HOSPITAL_BASED_OUTPATIENT_CLINIC_OR_DEPARTMENT_OTHER): Payer: Self-pay | Admitting: *Deleted

## 2021-11-23 NOTE — Telephone Encounter (Signed)
Spoke with patient regarding [place] sleep study She forgot and will do tonight

## 2021-11-24 ENCOUNTER — Encounter (INDEPENDENT_AMBULATORY_CARE_PROVIDER_SITE_OTHER): Payer: Federal, State, Local not specified - PPO | Admitting: Cardiology

## 2021-11-24 DIAGNOSIS — G4733 Obstructive sleep apnea (adult) (pediatric): Secondary | ICD-10-CM

## 2021-11-25 ENCOUNTER — Ambulatory Visit: Payer: Federal, State, Local not specified - PPO

## 2021-11-25 DIAGNOSIS — I10 Essential (primary) hypertension: Secondary | ICD-10-CM

## 2021-11-25 DIAGNOSIS — R400 Somnolence: Secondary | ICD-10-CM

## 2021-11-25 DIAGNOSIS — R0683 Snoring: Secondary | ICD-10-CM

## 2021-11-25 NOTE — Procedures (Signed)
° °  Sleep Study Report  Patient Information Study Date: 11/24/21 Patient Name: Susan Sutton Patient ID: [id number] Birth Date: Jul 02, 2071 Age: 51 Gender: Female BMI: 50.5 (W=322 lb, H=5' 7'') Neck Circ.: 17 '' Referring Physician:Tiffany Duke Salvia, MD   TEST DESCRIPTION: Home sleep apnea testing was completed using the WatchPat, a Type 1 device, utilizing peripheral arterial tonometry (PAT), chest movement, actigraphy, pulse oximetry, pulse rate, body position and snore. AHI was calculated with apnea and hypopnea using valid sleep time as the denominator. RDI includes apneas, hypopneas, and RERAs. The data acquired and the scoring of sleep and all associated events were performed in accordance with the recommended standards and specifications as outlined in the AASM Manual for the Scoring of Sleep and Associated Events 2.2.0 (2015).  FINDINGS: 1. Mild Obstructive Sleep Apnea with AHI 7.1/hr. 2. No Central Sleep Apnea with pAHIc 0/hr. 3. Oxygen desaturations as low as 77%. 4. Severe snoring was present. O2 sats were < 88% for 2.3 min. 5. Total sleep time was 4 hrs and 48 min. 6. 21.9% of total sleep time was spent in REM sleep. 7. Normal sleep onset latency at 25 min. 8. Shortened REM sleep onset latency at 36 min. 9. Total awakenings were 6.  DIAGNOSIS: Mild Obstructive Sleep Apnea (G47.33)  RECOMMENDATIONS: 1. Clinical correlation of these findings is necessary. The decision to treat obstructive sleep apnea (OSA) is usually based on the presence of apnea symptoms or the presence of associated medical conditions such as Hypertension, Congestive Heart Failure, Atrial Fibrillation or Obesity. The most common symptoms of OSA are snoring, gasping for breath while sleeping, daytime sleepiness and fatigue.  2. Initiating apnea therapy is recommended given the presence of symptoms and/or associated conditions. Recommend proceeding with one of the following:   a. Auto-CPAP therapy  with a pressure range of 5-20cm H2O.   b. An oral appliance (OA) that can be obtained from certain dentists with expertise in sleep medicine. These are primarily of use in non-obese patients with mild and moderate disease.   c. An ENT consultation which may be useful to look for specific causes of obstruction and possible treatment options.   d. If patient is intolerant to PAP therapy, consider referral to ENT for evaluation for hypoglossal nerve stimulator.  3. Close follow-up is necessary to ensure success with CPAP or oral appliance therapy for maximum benefit .  4. A follow-up oximetry study on CPAP is recommended to assess the adequacy of therapy and determine the need for supplemental oxygen or the potential need for Bi-level therapy. An arterial blood gas to determine the adequacy of baseline ventilation and oxygenation should also be considered.  5. Healthy sleep recommendations include: adequate nightly sleep (normal 7-9 hrs/night), avoidance of caffeine after noon and alcohol near bedtime, and maintaining a sleep environment that is cool, dark and quiet.  6. Weight loss for overweight patients is recommended. Even modest amounts of weight loss can significantly improve the severity of sleep apnea.  7. Snoring recommendations include: weight loss where appropriate, side sleeping, and avoidance of alcohol before bed.  8. Operation of motor vehicle should be avoided when sleepy.  Signature: Electronically Signed: 11/25/21 Armanda Magic, MD; [facility]; Diplomat, American Board of Sleep Medicine

## 2021-11-29 ENCOUNTER — Ambulatory Visit: Payer: Federal, State, Local not specified - PPO | Admitting: Pharmacist Clinician (PhC)/ Clinical Pharmacy Specialist

## 2021-11-29 ENCOUNTER — Other Ambulatory Visit: Payer: Self-pay

## 2021-11-29 NOTE — Patient Instructions (Addendum)
Start Ozempic 0.25 mg once weekly for at least 4 weeks.  At week 5 if you are feeling fine, increase the dose to 0.5 mg weekly.   You will do this for 4-6 weeks and again if you are feeling well, we will increase to 1 mg weekly.    We will see you back in 3 months, at which time we will check your A1c again, to see if any improvement.    Next appointment April 27 at 4 pm

## 2021-11-29 NOTE — Progress Notes (Signed)
Patient ID: Susan Sutton                 DOB: 1971/01/04                    MRN: [id number]    Sister diabetic had bariatic surgery but still on insulin Eating out a little more than previous, has been working to get better Has workout partner, now planning 2 days per week  - accountability   HPI: Susan Sutton is a 51 y.o. female patient referred to pharmacy clinic by Dr. Oval Linsey to initiate weight loss therapy with GLP1-RA. PMH is significant for obesity complicated by chronic medical conditions including diabetes and hypertension. Most recent BMI 50.56, previous BMI 51.09.  We have worked with patient in the past on her blood pressure issues and recent labs drawn in December show that her A1c has increased to 6.6.  She is not currently on any medications for diabetes.    Current weight management medications: none  Previously tried meds: none  Current meds that may affect weight: none  Baseline weight/BMI: 50.56  Insurance payor: [employer] FEP  Diet: mostly eating home cooked foods and watching salt intake more closely; trying to get mix of proteins.  Still eating out some, as it's hard for her to work all day then cook for just herself.    Exercise: finished PREP exercise last summer, now has gym buddy that helps "keep her honest", gets to gym usually 2 days per week  Family History: mother died from MI at 74, also had DM, amputation; cousin (maternal side) died from MI at 68, another with AF; father with hypertension, DM, died from drug abuse issues  Social History: no tobacco, rare alcohol, does drink coffee most days, has cut way back on sweet tea  Labs: Lab Results  Component Value Date   HGBA1C 6.6 (H) 10/16/2021    Wt Readings from Last 1 Encounters:  11/29/21 (!) 322 lb 12.8 oz (146.4 kg)    BP Readings from Last 1 Encounters:  11/29/21 140/84   Pulse Readings from Last 1 Encounters:  11/29/21 76       Component Value Date/Time   CHOL 159 10/16/2021 1119   TRIG 59  10/16/2021 1119   HDL 52 10/16/2021 1119   CHOLHDL 3.1 10/16/2021 1119   [place] 10/16/2021 1119    Past Medical History:  Diagnosis Date   Angina pectoris ([place]) 11/23/2020   Essential hypertension 11/23/2020   Hepatitis    "b"    Morbid obesity ([place]) 11/23/2020   Pre-diabetes    diet controlled - no meds- does not check blood sugar   Snoring 07/13/2021    Current Outpatient Medications on File Prior to Visit  Medication Sig Dispense Refill   acetaminophen (TYLENOL) 650 MG CR tablet Take 650 mg by mouth every 8 (eight) hours as needed for pain.     amLODipine (NORVASC) 5 MG tablet Take 1 tablet (5 mg total) by mouth daily. 90 tablet 3   chlorthalidone (HYGROTON) 25 MG tablet Take 1/2 (one-half) tablet by mouth once daily 15 tablet 3   metoprolol succinate (TOPROL-XL) 50 MG 24 hr tablet Take 0.5 tablets (25 mg total) by mouth daily. Take with or immediately following a meal. 90 tablet 3   No current facility-administered medications on file prior to visit.    No Known Allergies   Assessment/Plan:  1. Weight loss - Patient has not met goal of at least 5% of body  weight loss with comprehensive lifestyle modifications alone in the past 3-6 months. Pharmacotherapy is appropriate to pursue as augmentation. Will start Ozempic. Confirmed patient not pregnant and no personal or family history of medullary thyroid carcinoma (MTC) or Multiple Endocrine Neoplasia syndrome type 2 (MEN 2).   Advised patient on common side effects including nausea, diarrhea, dyspepsia, decreased appetite, and fatigue. Counseled patient on reducing meal size and how to titrate medication to minimize side effects. Counseled patient to call if intolerable side effects or if experiencing dehydration, abdominal pain, or dizziness. Patient will adhere to dietary modifications and will target at least 150 minutes of moderate intensity exercise weekly.   Follow up in 3 months. (02/28/2022)  Tommy Medal PharmD CPP  [facility] [address] [place], [place] [address]

## 2021-11-30 ENCOUNTER — Encounter (HOSPITAL_BASED_OUTPATIENT_CLINIC_OR_DEPARTMENT_OTHER): Payer: Self-pay | Admitting: Cardiovascular Disease

## 2021-11-30 ENCOUNTER — Encounter: Payer: Self-pay | Admitting: Pharmacist Clinician (PhC)/ Clinical Pharmacy Specialist

## 2021-11-30 MED ORDER — OZEMPIC (0.25 OR 0.5 MG/DOSE) 2 MG/1.5ML ~~LOC~~ SOPN: 0.5000 mg | PEN_INJECTOR | SUBCUTANEOUS | 0 refills | Status: DC

## 2021-12-05 ENCOUNTER — Telehealth: Payer: Self-pay | Admitting: *Deleted

## 2021-12-05 NOTE — Telephone Encounter (Signed)
The patient has been notified of the result and verbalized understanding.  All questions (if any) were answered. Susan Sutton, CMA 12/05/2021 5:05 PM    Patient will talk to her cardiologist and get back to our office with her decision to move forward or decline.

## 2021-12-05 NOTE — Telephone Encounter (Signed)
-----   Message from Quintella Reichert, MD sent at 11/25/2021  8:49 AM EST ----- Order ResMed CPAP on auto from 4 to 15cm H2O with heated humidity, mask of choice  Followup in 6 weeks with me.

## 2021-12-07 NOTE — Telephone Encounter (Signed)
My recommendation is that she get the CPAP.    TCR   Advised patient, verbalized understanding   Will forward to sleep pool to reach out again

## 2021-12-14 NOTE — Telephone Encounter (Signed)
Upon patient request DME selection is [facility]. Patient understands he will be contacted by [facility] to set up his cpap.Patient understands to call if [facility] does not contact him with new setup in a timely manner. Patient understands they will be called once confirmation has been received from [facility]/ that they have received their new machine to schedule 10 week follow up appointment.  [facility] notified of new cpap order  Please add to airview Patient was grateful for the call and thanked me. 

## 2022-01-02 ENCOUNTER — Encounter: Payer: Self-pay | Admitting: Pharmacist Clinician (PhC)/ Clinical Pharmacy Specialist

## 2022-01-08 DIAGNOSIS — G4733 Obstructive sleep apnea (adult) (pediatric): Secondary | ICD-10-CM | POA: Diagnosis not present

## 2022-01-24 IMAGING — DX DG FINGER LITTLE 2+V*R*
3 series · 3 of 3 positions shown · non-contrast
Comparison: None.

CLINICAL DATA: Right little finger pain and deformity secondary to
a fall today.

EXAM:
RIGHT LITTLE FINGER 2+V

[finger ap]
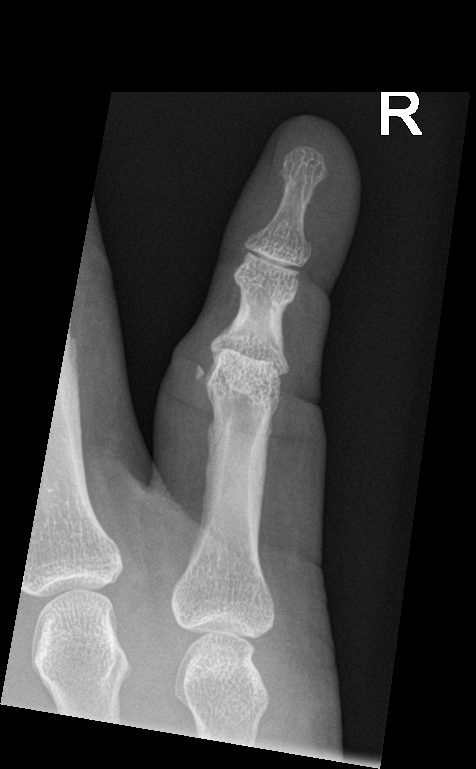

[finger obl]
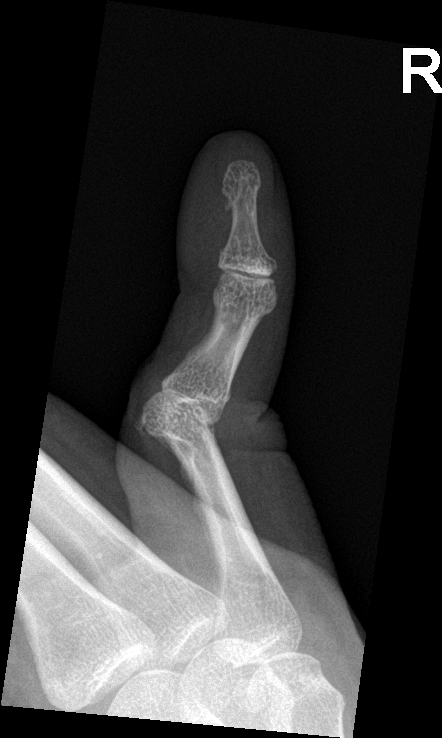

[finger lat]
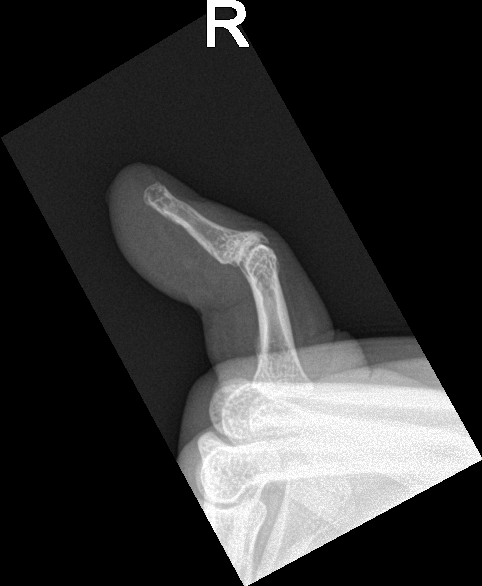

[3 of 3 positions shown; findings below may reference images not displayed]

FINDINGS: There is dorsal dislocation at the PIP joint of right little finger.
There is a small avulsion of bone along the radial aspect of the PIP
joint. This is probably from the volar aspect of the base of the
middle phalanx.
IMPRESSION: Dorsal dislocation of the PIP joint of the right little finger with
a small avulsion fracture.

## 2022-01-24 IMAGING — DX DG FINGER LITTLE 2+V*R*
3 series · 3 of 3 positions shown · non-contrast
Comparison: 01/05/2020

CLINICAL DATA: Postreduction

EXAM:
RIGHT LITTLE FINGER 2+V

[finger ap]
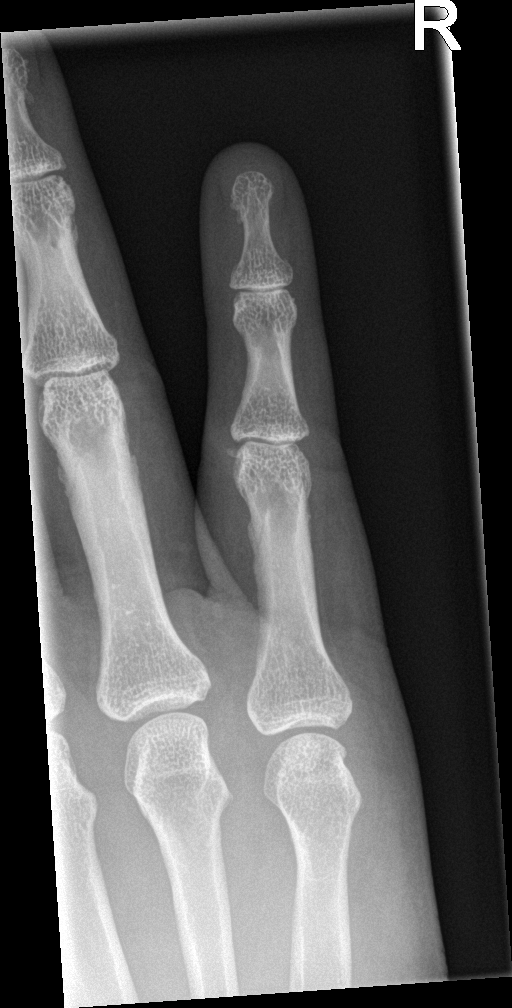

[finger obl]
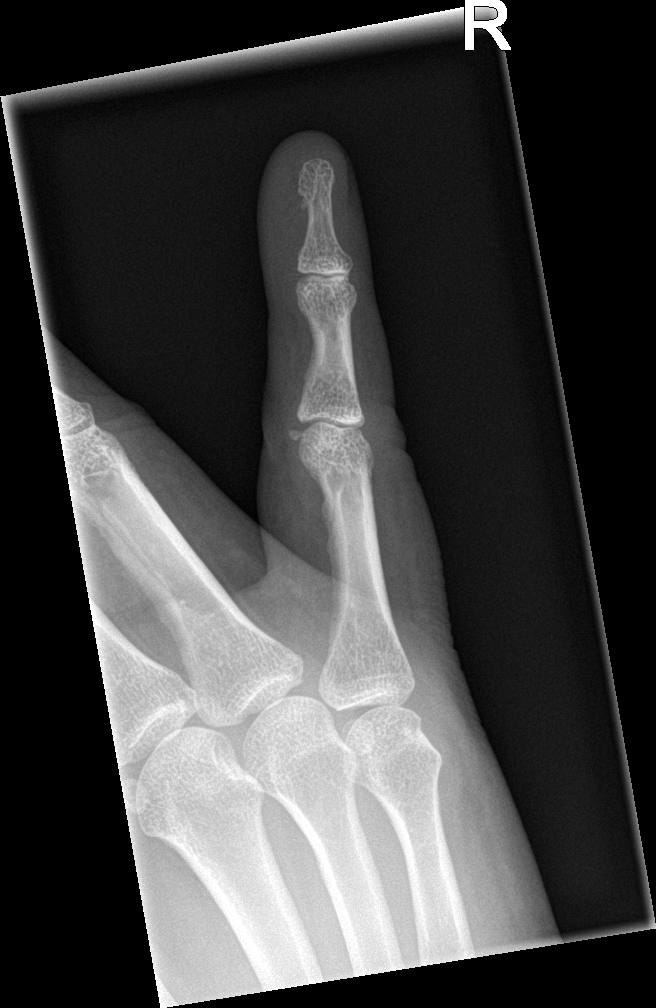

[finger lat]
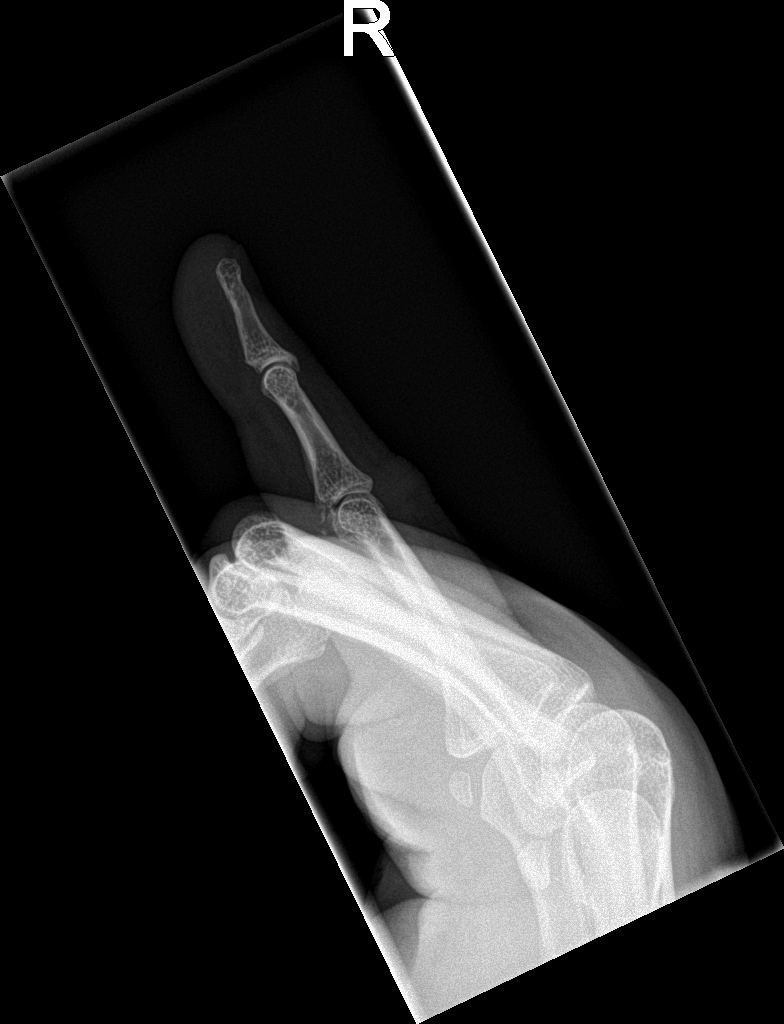

[3 of 3 positions shown; findings below may reference images not displayed]

FINDINGS: Interval reduction of the previously seen dislocated right little
finger PIP joint. Small avulsed fragments noted. Normal alignment.
IMPRESSION: Interval reduction of the PIP dislocation. Small avulsed fragments
again noted anteriorly.

## 2022-02-08 DIAGNOSIS — G4733 Obstructive sleep apnea (adult) (pediatric): Secondary | ICD-10-CM | POA: Diagnosis not present

## 2022-02-11 ENCOUNTER — Other Ambulatory Visit: Payer: Self-pay | Admitting: Cardiovascular Disease

## 2022-02-11 ENCOUNTER — Encounter: Payer: Self-pay | Admitting: Pharmacist Clinician (PhC)/ Clinical Pharmacy Specialist

## 2022-02-11 MED ORDER — OZEMPIC (0.25 OR 0.5 MG/DOSE) 2 MG/1.5ML ~~LOC~~ SOPN: 0.5000 mg | PEN_INJECTOR | SUBCUTANEOUS | 0 refills | Status: DC

## 2022-02-21 ENCOUNTER — Ambulatory Visit (HOSPITAL_BASED_OUTPATIENT_CLINIC_OR_DEPARTMENT_OTHER): Payer: Federal, State, Local not specified - PPO | Admitting: Cardiovascular Disease

## 2022-02-26 ENCOUNTER — Encounter (HOSPITAL_BASED_OUTPATIENT_CLINIC_OR_DEPARTMENT_OTHER): Payer: Self-pay | Admitting: Cardiovascular Disease

## 2022-02-26 ENCOUNTER — Telehealth (HOSPITAL_BASED_OUTPATIENT_CLINIC_OR_DEPARTMENT_OTHER): Payer: Self-pay | Admitting: *Deleted

## 2022-02-26 NOTE — Telephone Encounter (Signed)
Below [employer] message sent ? ?Vida Maxham  P Cv Div Dwb Triage (supporting Chilton Si, MD) 4 hours ago (8:11 AM)  ? ?LH ?Good morning Dr.  Duke Salvia,  ?  ?I thought my appointment this week was with you,  but just realized it's actually with Belenda Cruise as a follow up with the new prescription I'm on. ?  ?I was hoping to talk with you about the stress I'm currently under on my job.  I'm noticing subtle fluctuations in my BP, constant headaches, inability to focus, and feelings exhaustion that lead to crying and that is in no way a reflection of my norm. We've been under mandated over-time for 3 months now,  working 55-60 hours per week. If I call out of work I'm penalized,  and if i try to schedule time it's denied.  I'm feeling stressed, and completely on edge of late.  I don't want this affecting my health negatively, but at this rate I don't see how it won't.   ?  ?I'd planned to talk with you about this to see if it was possible to take me out of work for a few days. Please let me know what can be done if anything.... I have NEVER experienced this in my over 40 years of working,  but I know I'm not in a good place right now . Please advise what can be done (office or virtual visit?).   ?  ?Thanks in advance for your time and consideration.  I look forward to hearing from you.  Have a great day! ? ? ?Will forward to Dr Duke Salvia for review  ?

## 2022-02-26 NOTE — Telephone Encounter (Signed)
Please advise 

## 2022-02-27 NOTE — Telephone Encounter (Signed)
Below mychart message sent ? ?Susan Sutton  P Cv Div Dwb Triage (supporting [place], Tiffany, MD) 4 hours ago (8:11 AM)  ? ?LH ?Good morning Dr.  Ruhenstroth,  ?  ?I thought my appointment this week was with you,  but just realized it's actually with Kristin as a follow up with the new prescription I'm on. ?  ?I was hoping to talk with you about the stress I'm currently under on my job.  I'm noticing subtle fluctuations in my BP, constant headaches, inability to focus, and feelings exhaustion that lead to crying and that is in no way a reflection of my norm. We've been under mandated over-time for 3 months now,  working 55-60 hours per week. If I call out of work I'm penalized,  and if i try to schedule time it's denied.  I'm feeling stressed, and completely on edge of late.  I don't want this affecting my health negatively, but at this rate I don't see how it won't.   ?  ?I'd planned to talk with you about this to see if it was possible to take me out of work for a few days. Please let me know what can be done if anything.... I have NEVER experienced this in my over 40 years of working,  but I know I'm not in a good place right now . Please advise what can be done (office or virtual visit?).   ?  ?Thanks in advance for your time and consideration.  I look forward to hearing from you.  Have a great day! ? ? ?Will forward to Dr Laporte for review  ?

## 2022-02-28 ENCOUNTER — Ambulatory Visit: Payer: Federal, State, Local not specified - PPO | Admitting: Pharmacist Clinician (PhC)/ Clinical Pharmacy Specialist

## 2022-02-28 NOTE — Progress Notes (Signed)
Patient ID: Susan Sutton                 DOB: [birth year]                    MRN: [id number] ? ? ? ? ?HPI: ?Susan Sutton is a 51 y.o. female patient referred to pharmacy clinic by Dr. Duke Salvia to follow up on weight loss therapy with GLP1-RA. PMH is significant for obesity complicated by chronic medical conditions including diabetes and hypertension. Most recent BMI 50.56, previous BMI 51.09.  We have worked with patient in the past on her blood pressure issues and labs drawn in December showed her A1c has increased to 6.6.  She is not currently on any medications for diabetes.   ? ?Since her last visit she has done well with the Ozempic.  She has 2 doses remaining of the 0.5 mg dose and has lost 16.5 lbs since starting.  Currently no issues with nausea/vomiting and is making a concerted effort to plan her food accordingly.  She notes her job continues to be overly stressful and is being asked to work 12 hour days, 5 days per week.   Because of this, she notes she has not been able to get to the gym as she would like.   ? ?Current weight management medications: none ? ?Previously tried meds: none ? ?Current meds that may affect weight: none ? ?Baseline weight/BMI: 50.56 ? ?Insurance payor: [employer] FEP ? ?Diet: mostly eating home cooked foods and watching salt intake more closely; trying to get mix of proteins.  Still eating out some, as it's hard for her to work all day then cook for just herself.   Has been more conscious of sugars and processed foods  Meals are now smaller protions, hasn't stopped eating, but definitely less; less on breads and sugars (now no bread in the house); lot less sweet tea (was up to 1 gal q2-3 days, now 1-2/month; using honey as sweetner for hot tea ? ?Exercise: has gym membership, but last month was not able to get there ? ?Family History: mother died from MI at 62, also had DM, amputation; cousin (maternal side) died from MI at 73, another with AF; father with hypertension, DM, died from drug  abuse issues ? ?Social History: no tobacco, rare alcohol, does drink coffee most days, has cut way back on sweet tea ? ?Labs: ?Lab Results  ?Component Value Date  ? HGBA1C 6.6 (H) 10/16/2021  ? ? ?Wt Readings from Last 1 Encounters:  ?02/28/22 (!) 306 lb 3.2 oz (138.9 kg)  ? ? ?BP Readings from Last 1 Encounters:  ?02/28/22 120/80  ? ?Pulse Readings from Last 1 Encounters:  ?02/28/22 74  ? ? ?   ?Component Value Date/Time  ? CHOL 159 10/16/2021 1119  ? TRIG 59 10/16/2021 1119  ? HDL 52 10/16/2021 1119  ? CHOLHDL 3.1 10/16/2021 1119  ? LDLCALC 95 10/16/2021 1119  ? ? ?Past Medical History:  ?Diagnosis Date  ? Angina pectoris (HCC) 11/23/2020  ? Essential hypertension 11/23/2020  ? Hepatitis   ? "b"   ? Morbid obesity (HCC) 11/23/2020  ? Pre-diabetes   ? diet controlled - no meds- does not check blood sugar  ? Snoring 07/13/2021  ? ? ?Current Outpatient Medications on File Prior to Visit  ?Medication Sig Dispense Refill  ? acetaminophen (TYLENOL) 650 MG CR tablet Take 650 mg by mouth every 8 (eight) hours as needed for pain.    ?  amLODipine (NORVASC) 5 MG tablet Take 1 tablet (5 mg total) by mouth daily. 90 tablet 3  ? chlorthalidone (HYGROTON) 25 MG tablet Take 1/2 (one-half) tablet by mouth once daily 45 tablet 3  ? metoprolol succinate (TOPROL-XL) 50 MG 24 hr tablet Take 0.5 tablets (25 mg total) by mouth daily. Take with or immediately following a meal. 90 tablet 3  ? Semaglutide,0.25 or 0.5MG /DOS, (OZEMPIC, 0.25 OR 0.5 MG/DOSE,) 2 MG/1.5ML SOPN Inject 0.5 mg into the skin once a week. 1.5 mL 0  ? ?No current facility-administered medications on file prior to visit.  ? ? ?No Known Allergies ? ? ?Assessment/Plan:   Weight loss - Patient has been on Ozempic for 8 weeks and has lost 5.5% of body weight.  Today at 138.9 kg.  Reviewed information on common side effects including nausea, diarrhea, dyspepsia, decreased appetite, and fatigue. Counseled patient on reducing meal size and how to titrate medication to minimize  side effects. Patient aware to call if intolerable side effects or if experiencing dehydration, abdominal pain, or dizziness. Patient will adhere to dietary modifications and will target at least 150 minutes of moderate intensity exercise weekly.  ? ?Titration Plan:  ?Will plan to follow the titration plan as below, pending patient is tolerating each dose before increasing to the next. Can slow titration if needed for tolerability.  ?  ?Finish next 2 weeks of 0.5 mg SQ weekly, then increase to  ?1 mg SQ weekly x 4 weeks, then ?2 mg SQ weekly thereafter. ? ?If unable to tolerate dose increase, patient aware to notify office to adjust refills on tolerated dose.   ? ?Follow up with Dr. Duke Salvia in 6 weeks, we can see her 8-10 weeks after that.   ? ? ?Phillips Hay PharmD CPP [facility] ?[facility] ?[address] ?[place], [place] [address] ?

## 2022-02-28 NOTE — Patient Instructions (Signed)
?  TIPS FOR SUCCESS ?Write down the reasons why you want to lose weight and post it in a place where you'll see it often. ?Start small and work your way up. Keep in mind that it takes time to achieve goals, and small steps add up. ?Any additional movements help to burn calories. Taking the stairs rather than the elevator and parking at the far end of your parking lot are easy ways to start. Brisk walking for at least 30 minutes 4 or more days of the week is an excellent goal to work toward ? ?UNDERSTANDING WHAT IT MEANS TO FEEL FULL ?Did you know that it can take 15 minutes or more for your brain to receive the message that you've eaten? That means that, if you eat less food, but consume it slower, you may still feel satisfied. ?Eating a lot of fruits and vegetables can also help you feel fuller. ?Eat off of smaller plates so that moderate portions don't seem too small ? ?TITRATION PLAN ?Will plan to follow the titration plan as below, pending patient is tolerating each dose before increasing to the next. Can slow titration if needed for tolerability.  ?  ?Finish off the 0.5 mg dose then increase to 1 mg weekly for 4 weeks then increase to 2 mg as tolerated ? ?If you have any questions or concerns, please reach out to [place].  Hayde Kilgour/Chris at [phone]. ? ?THANK YOU FOR CHOOSING [facility]  ?

## 2022-03-01 ENCOUNTER — Encounter: Payer: Self-pay | Admitting: Pharmacist Clinician (PhC)/ Clinical Pharmacy Specialist

## 2022-03-01 NOTE — Assessment & Plan Note (Signed)
1. Weight loss - Patient has been on Ozempic for 8 weeks and has lost 5.5% of body weight.  Today at 138.9 kg.  Reviewed information on common side effects including nausea, diarrhea, dyspepsia, decreased appetite, and fatigue. Counseled patient on reducing meal size and how to titrate medication to minimize side effects. Patient aware to call if intolerable side effects or if experiencing dehydration, abdominal pain, or dizziness. Patient will adhere to dietary modifications and will target at least 150 minutes of moderate intensity exercise weekly.  ? ?Titration Plan:  ?Will plan to follow the titration plan as below, pending patient is tolerating each dose before increasing to the next. Can slow titration if needed for tolerability.  ?  ?Finish next 2 weeks of 0.5 mg SQ weekly, then increase to  ?1 mg SQ weekly x 4 weeks, then ?2 mg SQ weekly thereafter. ? ?If unable to tolerate dose increase, patient aware to notify office to adjust refills on tolerated dose.   ? ?Follow up with Dr. Oval Linsey in 6 weeks, we can see her 8-10 weeks after that.   ?

## 2022-03-05 ENCOUNTER — Encounter: Payer: Self-pay | Admitting: Pharmacist Clinician (PhC)/ Clinical Pharmacy Specialist

## 2022-03-05 NOTE — Telephone Encounter (Signed)
March 01, 2022 ?Chilton Si, MD ?to Me   ?   2:36 PM ?OK with giving her a couple days off work.  Sounds like an unfortunate and unhealthy work environment.  Recommend talking with HR about the effect of increased requirements on her health, consider trying to find a new job, or talking to PCP/therapy about ways to better manage stress/anxiety.  We can also have our health coach call her to discuss options and help manage stress if she would like.  ? ?TCR  ? ?Mychart message sent to patient and message sent to Amy  ?

## 2022-03-06 ENCOUNTER — Encounter (HOSPITAL_BASED_OUTPATIENT_CLINIC_OR_DEPARTMENT_OTHER): Payer: Self-pay | Admitting: Pharmacist Clinician (PhC)/ Clinical Pharmacy Specialist

## 2022-03-07 ENCOUNTER — Telehealth: Payer: Self-pay

## 2022-03-07 DIAGNOSIS — Z0000 Encounter for general adult medical examination without abnormal findings: Secondary | ICD-10-CM

## 2022-03-07 NOTE — Telephone Encounter (Signed)
Called patient per referral for health coaching for stress management. Patient explained the source of her stress is work and shared that she would have to remove herself from the toxic environment to resolve the issue. Patient is implementing stress management strategies such as taking breaks, walking the dog, and implementing positive self-talk. Patient shared after work she is exhausted and is not able to plan any additional down time because her work hours is unpredictable due to overtime. Patient stated that her workload is soon to increase which will have a direct impact on her health. Patient shared that she requested a medical leave from work. Patient appreciated the call and offer for health coaching but stressed the need for time off of work. Checked patient's messages and see that Dr. Duke Salvia has reviewed this request. Raynald Kemp a staff message regarding this issue for further follow up with the patient.  ? ? ?Renaee Munda, [facility] ?[facility] ?Care Guide, Health Coach ?[address] ?[place] [address] ?Telephone: [phone] ?Email: [email or web address] ? ?

## 2022-03-10 DIAGNOSIS — G4733 Obstructive sleep apnea (adult) (pediatric): Secondary | ICD-10-CM | POA: Diagnosis not present

## 2022-03-16 ENCOUNTER — Other Ambulatory Visit (HOSPITAL_BASED_OUTPATIENT_CLINIC_OR_DEPARTMENT_OTHER): Payer: Self-pay | Admitting: Cardiovascular Disease

## 2022-03-19 ENCOUNTER — Other Ambulatory Visit: Payer: Self-pay | Admitting: Pharmacist Clinician (PhC)/ Clinical Pharmacy Specialist

## 2022-03-19 MED ORDER — OZEMPIC (1 MG/DOSE) 4 MG/3ML ~~LOC~~ SOPN: 1.0000 mg | PEN_INJECTOR | SUBCUTANEOUS | 0 refills | Status: DC

## 2022-03-21 ENCOUNTER — Encounter (HOSPITAL_BASED_OUTPATIENT_CLINIC_OR_DEPARTMENT_OTHER): Payer: Self-pay | Admitting: *Deleted

## 2022-04-02 DIAGNOSIS — F430 Acute stress reaction: Secondary | ICD-10-CM | POA: Diagnosis not present

## 2022-04-02 DIAGNOSIS — D689 Coagulation defect, unspecified: Secondary | ICD-10-CM | POA: Diagnosis not present

## 2022-04-02 DIAGNOSIS — Z0001 Encounter for general adult medical examination with abnormal findings: Secondary | ICD-10-CM | POA: Diagnosis not present

## 2022-04-02 DIAGNOSIS — Z113 Encounter for screening for infections with a predominantly sexual mode of transmission: Secondary | ICD-10-CM | POA: Diagnosis not present

## 2022-04-02 DIAGNOSIS — Z202 Contact with and (suspected) exposure to infections with a predominantly sexual mode of transmission: Secondary | ICD-10-CM | POA: Diagnosis not present

## 2022-04-02 DIAGNOSIS — E669 Obesity, unspecified: Secondary | ICD-10-CM | POA: Diagnosis not present

## 2022-04-02 DIAGNOSIS — G4733 Obstructive sleep apnea (adult) (pediatric): Secondary | ICD-10-CM | POA: Diagnosis not present

## 2022-04-02 DIAGNOSIS — I10 Essential (primary) hypertension: Secondary | ICD-10-CM | POA: Diagnosis not present

## 2022-04-02 DIAGNOSIS — E1165 Type 2 diabetes mellitus with hyperglycemia: Secondary | ICD-10-CM | POA: Diagnosis not present

## 2022-04-08 DIAGNOSIS — Z1231 Encounter for screening mammogram for malignant neoplasm of breast: Secondary | ICD-10-CM | POA: Diagnosis not present

## 2022-04-10 DIAGNOSIS — G4733 Obstructive sleep apnea (adult) (pediatric): Secondary | ICD-10-CM | POA: Diagnosis not present

## 2022-04-16 DIAGNOSIS — I10 Essential (primary) hypertension: Secondary | ICD-10-CM | POA: Diagnosis not present

## 2022-04-16 DIAGNOSIS — N924 Excessive bleeding in the premenopausal period: Secondary | ICD-10-CM | POA: Diagnosis not present

## 2022-04-16 DIAGNOSIS — N951 Menopausal and female climacteric states: Secondary | ICD-10-CM | POA: Diagnosis not present

## 2022-04-16 DIAGNOSIS — Z01419 Encounter for gynecological examination (general) (routine) without abnormal findings: Secondary | ICD-10-CM | POA: Diagnosis not present

## 2022-04-16 DIAGNOSIS — R9389 Abnormal findings on diagnostic imaging of other specified body structures: Secondary | ICD-10-CM | POA: Diagnosis not present

## 2022-04-18 ENCOUNTER — Ambulatory Visit (HOSPITAL_BASED_OUTPATIENT_CLINIC_OR_DEPARTMENT_OTHER): Payer: Federal, State, Local not specified - PPO | Admitting: Cardiovascular Disease

## 2022-04-18 ENCOUNTER — Encounter (HOSPITAL_BASED_OUTPATIENT_CLINIC_OR_DEPARTMENT_OTHER): Payer: Self-pay | Admitting: Cardiovascular Disease

## 2022-04-18 DIAGNOSIS — G4733 Obstructive sleep apnea (adult) (pediatric): Secondary | ICD-10-CM | POA: Diagnosis not present

## 2022-04-18 DIAGNOSIS — I10 Essential (primary) hypertension: Secondary | ICD-10-CM

## 2022-04-18 NOTE — Assessment & Plan Note (Signed)
She is doing a great job with weight loss.   Encouraged her to keep up her exercise and limit sodium intake.  Continue Ozempic.

## 2022-04-18 NOTE — Assessment & Plan Note (Signed)
Blood pressure is well-controlled on amlodipine, metoprolol and chlorthalidone.  She was congratulated on her diet and exercise efforts.  Encouraged to get at least 150 minutes of exercise weekly.  Recommended that she see dermatology for her acne and hyperpigmentation.  If they recommend starting spironolactone we can reduce or stop her chlorthalidone.

## 2022-04-18 NOTE — Progress Notes (Signed)
Advanced Hypertension Follow-Up   Date:  04/18/2022   ID:  Susan Sutton, DOB [birth year], MRN [id number]  PCP:  Diamantina Providence, FNP  Cardiologist:  None  Nephrologist:  Referring MD: Diamantina Providence, FNP   CC: Hypertension  History of Present Illness:    Susan Sutton is a 51 y.o. female with hypertension, morbid obesity, NSVT, OSA on CPAP, and diabetes here for advanced hypertension follow-up. She was initially seen 1/22 for hypertension. She has been working form home due to the pandemic. She saw Dr. Cherly Hensen on 09/2020 and her BP was 160/88.  She has gained a lot of weight since working from home.  Susan Sutton previously saw cardiologist through [facility] for palpitations.  She wore an ambulatory monitor that showed 4 runs of NSVT with rare PVCs and PACs.  She was started on metoprolol 09/2020.  She did not notice much change in her symptoms.  Despite this her blood pressure has continued to be elevated.  At home it has been running in the 150s to 170s over 80s to 90s.  She mostly cooks at home.  She is using air Eunice Blase and switch to [employer] because she heard this was better.  She drinks 1 to 212 ounce coffees daily.  She was drinking up to a gallon of sweet tea.  She is adding Trulicity to her coffee instead of sugar.  She is trying to slowly cut back on how much tea she drinks.  She does snore and has some daytime somnolence, though she attributes this to her job being boring.  She has chronic pain from sciatica and takes ibuprofen at least once or twice daily for either her back pain or menstrual cramps.  She notes a strong family history of heart disease in the women in her family. She started on Amlodipine but developed swelling but switched to Chlorthalidone. She enrolled in the PREP program and really enjoyed it.    At her last appointment her blood pressure was better controlled, and we recommended increasing her exercise. She was started on Ozempic 11/2021. She called the office  regarding work stress and whether it was related to her blood pressure. She requested FMLA paperwarod due to the affect of her work stress on her health.  She has been doing well and feeling better. She goes to the gym regularly 3 days a week to do a 30-40 minute workouts. She says that she has a CPAP machine, but it took her a while to find an mask that fit well.  After finding a mask, she developed sinus infection, and had a tooth pulled  and was unable to wear her CPAP.  She does plan to start using it after she recovers. She admits that she has been eating salty foods and sugary drinks like sodas. In the evenings she sometimes has swelling in her feet/legs, and she props her feet up at night when going to bed. She is concerned about the discoloration on her lower legs and on her face. She uses acne cleansing gel to manage her acne, and she believes its hormonal. She regularly sees her therapist once a month or twice a month. Plans to find another job that is less stressful, and lose 50 lbs by her 51th birthday. The patient denies chest pain, shortness of breath, nocturnal dyspnea, or orthopnea.  There have been no palpitations, lightheadedness or syncope.   Past Medical History:  Diagnosis Date   Angina pectoris (HCC) 11/23/2020   Essential hypertension 11/23/2020  Hepatitis    "b"    Morbid obesity (HCC) 11/23/2020   OSA (obstructive sleep apnea) 07/13/2021   Pre-diabetes    diet controlled - no meds- does not check blood sugar   Snoring 07/13/2021    Past Surgical History:  Procedure Laterality Date   DILATATION & CURRETTAGE/HYSTEROSCOPY WITH RESECTOCOPE N/A 01/19/2021   Procedure: DILATATION & CURETTAGE/HYSTEROSCOPY WITH MYOSURE;  Surgeon: Maxie Better, MD;  Location: [facility] OR;  Service: Gynecology;  Laterality: N/A;   KNEE ARTHROSCOPY Right 08/22/2014   Procedure: ARTHROSCOPY KNEE;  Surgeon: Dannielle Huh, MD;  Location: [facility] OR;  Service: Orthopedics;  Laterality: Right;  Partial medial  menisectomy   WISDOM TOOTH EXTRACTION      Current Medications: Current Meds  Medication Sig   acetaminophen (TYLENOL) 650 MG CR tablet Take 650 mg by mouth every 8 (eight) hours as needed for pain.   amLODipine (NORVASC) 5 MG tablet Take 1 tablet (5 mg total) by mouth daily.   chlorthalidone (HYGROTON) 25 MG tablet Take 1/2 (one-half) tablet by mouth once daily   metoprolol succinate (TOPROL-XL) 50 MG 24 hr tablet Take 0.5 tablets (25 mg total) by mouth daily. Take with or immediately following a meal.   Semaglutide, 1 MG/DOSE, (OZEMPIC, 1 MG/DOSE,) 4 MG/3ML SOPN Inject 1 mg into the skin once a week.     Allergies:   Patient has no known allergies.   Social History   Socioeconomic History   Marital status: Single    Spouse name: Not on file   Number of children: Not on file   Years of education: Not on file   Highest education level: Not on file  Occupational History   Not on file  Tobacco Use   Smoking status: Never   Smokeless tobacco: Never  Vaping Use   Vaping Use: Never used  Substance and Sexual Activity   Alcohol use: Yes    Comment: occ   Drug use: No   Sexual activity: Yes    Birth control/protection: Condom  Other Topics Concern   Not on file  Social History Narrative   Not on file   Social Determinants of Health   Financial Resource Strain: Low Risk  (07/13/2021)   Overall Financial Resource Strain (CARDIA)    Difficulty of Paying Living Expenses: Not hard at all  Food Insecurity: No Food Insecurity (07/13/2021)   Hunger Vital Sign    Worried About Running Out of Food in the Last Year: Never true    Ran Out of Food in the Last Year: Never true  Transportation Needs: No Transportation Needs (07/13/2021)   PRAPARE - Administrator, Civil Service (Medical): No    Lack of Transportation (Non-Medical): No  Physical Activity: Insufficiently Active (07/13/2021)   Exercise Vital Sign    Days of Exercise per Week: 2 days    Minutes of Exercise per  Session: 30 min  Stress: Not on file  Social Connections: Not on file     Family History: The patient's family history includes Alzheimer's disease in her maternal grandmother; Breast cancer in her paternal aunt and paternal grandmother; Colon cancer in her paternal uncle; Colon polyps in her sister; Congestive Heart Failure in her father and mother; Diabetes in her father, maternal grandmother, mother, and sister; Epilepsy in her maternal grandmother; Heart attack in her father and mother; Hyperlipidemia in her sister; Hypertension in her brother, father, maternal grandmother, mother, and sister; Lupus in her sister; Stroke in her mother.  ROS:  Please see the history of present illness. (+) LE Edema (+) Acne (+) Skin discoloration All other systems are reviewed and negative.    EKGs/Labs/Other Studies Reviewed:    EKG: EKG is personally reviewed.  9/22: Sinus rhythm  71 bpm.  1/22:Sinus rhythm.  Rate 73 bpm.  Recent Labs: 10/16/2021: ALT 16; BUN 13; Creatinine, Ser 1.00; Potassium 3.8; Sodium 141   07/05/19: Total cholesterol 151, triglycerides 62, HDL 51, LDL 87  Recent Lipid Panel    Component Value Date/Time   CHOL 159 10/16/2021 1119   TRIG 59 10/16/2021 1119   HDL 52 10/16/2021 1119   CHOLHDL 3.1 10/16/2021 1119   LDLCALC 95 10/16/2021 1119    Physical Exam:    VS:  BP 117/79 (BP Location: Right Arm, Patient Position: Sitting, Cuff Size: Large)   Pulse 87   Ht 5\' 7"  (1.702 m)   Wt (!) 302 lb 12.8 oz (137.3 kg)   SpO2 98%   BMI 47.43 kg/m  , BMI Body mass index is 47.43 kg/m. GENERAL:  Well appearing HEENT: Pupils equal round and reactive, fundi not visualized, oral mucosa unremarkable NECK:  No jugular venous distention, waveform within normal limits, carotid upstroke brisk and symmetric, no bruits LUNGS:  Clear to auscultation bilaterally HEART:  RRR.  PMI not displaced or sustained,S1 and S2 within normal limits, no S3, no S4, no clicks, no rubs, no  murmurs ABD:  Flat, positive bowel sounds normal in frequency in pitch, no bruits, no rebound, no guarding, no midline pulsatile mass, no hepatomegaly, no splenomegaly EXT:  2 plus pulses throughout, no edema, no cyanosis no clubbing SKIN:  hyperpigmentation on lower anterior tibia NEURO:  Cranial nerves II through XII grossly intact, motor grossly intact throughout PSYCH:  Cognitively intact, oriented to person place and time  ASSESSMENT:    1. Essential hypertension   2. Morbid obesity (HCC)   3. OSA (obstructive sleep apnea)    PLAN:    Essential hypertension Blood pressure is well-controlled on amlodipine, metoprolol and chlorthalidone.  She was congratulated on her diet and exercise efforts.  Encouraged to get at least 150 minutes of exercise weekly.  Recommended that she see dermatology for her acne and hyperpigmentation.  If they recommend starting spironolactone we can reduce or stop her chlorthalidone.   Morbid obesity (HCC) She is doing a great job with weight loss.   Encouraged her to keep up her exercise and limit sodium intake.  Continue Ozempic.  OSA (obstructive sleep apnea) Continue CPAP and working on diet and exercise.   Disposition:    FU with Jaquaveon Bilal C. , MD, [facility] in 6 months   Medication Adjustments/Labs and Tests Ordered: Current medicines are reviewed at length with the patient today.  Concerns regarding medicines are outlined above.  No orders of the defined types were placed in this encounter.    No orders of the defined types were placed in this encounter.   I,Tinashe Williams,acting as a [facility] for Neurosurgeon, MD.,have documented all relevant documentation on the behalf of [employer], MD,as directed by  Chilton Si, MD while in the presence of Chilton Si, MD.   I, Shamiah Kahler C. Chilton Si, MD have reviewed all documentation for this visit.  The documentation of the exam, diagnosis, procedures, and orders on 04/18/2022 are all  accurate and complete.

## 2022-04-18 NOTE — Assessment & Plan Note (Signed)
Continue CPAP and working on diet and exercise.

## 2022-04-18 NOTE — Patient Instructions (Signed)
Medication Instructions:  Your physician recommends that you continue on your current medications as directed. Please refer to the Current Medication list given to you today.   *If you need a refill on your cardiac medications before your next appointment, please call your pharmacy*  Lab Work: NONE  Testing/Procedures: NONE  Follow-Up: At [facility], you and your health needs are our priority.  As part of our continuing mission to provide you with exceptional heart care, we have created designated Provider Care Teams.  These Care Teams include your primary Cardiologist (physician) and Advanced Practice Providers (APPs -  Physician Assistants and Nurse Practitioners) who all work together to provide you with the care you need, when you need it.  We recommend signing up for the patient portal called "MyChart".  Sign up information is provided on this After Visit Summary.  MyChart is used to connect with patients for Virtual Visits (Telemedicine).  Patients are able to view lab/test results, encounter notes, upcoming appointments, etc.  Non-urgent messages can be sent to your provider as well.   To learn more about what you can do with MyChart, go to [email or web address].    Your next appointment:   10/07/2022 AT 4:20 PM WITH DR [facility]   Dr. Moshe [facility] [phone]

## 2022-04-22 ENCOUNTER — Other Ambulatory Visit: Payer: Self-pay | Admitting: Cardiovascular Disease

## 2022-04-22 NOTE — Telephone Encounter (Signed)
Rx request sent to pharmacy.  

## 2022-04-23 DIAGNOSIS — E1165 Type 2 diabetes mellitus with hyperglycemia: Secondary | ICD-10-CM | POA: Diagnosis not present

## 2022-04-23 DIAGNOSIS — Z1211 Encounter for screening for malignant neoplasm of colon: Secondary | ICD-10-CM | POA: Diagnosis not present

## 2022-04-23 DIAGNOSIS — E669 Obesity, unspecified: Secondary | ICD-10-CM | POA: Diagnosis not present

## 2022-04-23 DIAGNOSIS — I10 Essential (primary) hypertension: Secondary | ICD-10-CM | POA: Diagnosis not present

## 2022-05-10 DIAGNOSIS — G4733 Obstructive sleep apnea (adult) (pediatric): Secondary | ICD-10-CM | POA: Diagnosis not present

## 2022-05-13 ENCOUNTER — Other Ambulatory Visit: Payer: Self-pay | Admitting: Cardiovascular Disease

## 2022-05-13 DIAGNOSIS — E119 Type 2 diabetes mellitus without complications: Secondary | ICD-10-CM

## 2022-05-13 NOTE — Telephone Encounter (Signed)
Please help with refill. Thank you!!

## 2022-05-14 NOTE — Telephone Encounter (Signed)
Started by Ilda Basset D will forward for review

## 2022-06-06 DIAGNOSIS — F439 Reaction to severe stress, unspecified: Secondary | ICD-10-CM | POA: Diagnosis not present

## 2022-06-10 DIAGNOSIS — G4733 Obstructive sleep apnea (adult) (pediatric): Secondary | ICD-10-CM | POA: Diagnosis not present

## 2022-06-13 ENCOUNTER — Other Ambulatory Visit: Payer: Self-pay | Admitting: Cardiovascular Disease

## 2022-06-13 DIAGNOSIS — E119 Type 2 diabetes mellitus without complications: Secondary | ICD-10-CM

## 2022-06-13 NOTE — Telephone Encounter (Signed)
Please review for refill. Thank you! 

## 2022-06-18 NOTE — Telephone Encounter (Signed)
Tried to call pt to confirm dose. Mailbox full

## 2022-06-20 ENCOUNTER — Other Ambulatory Visit: Payer: Self-pay | Admitting: Cardiovascular Disease

## 2022-06-20 DIAGNOSIS — E119 Type 2 diabetes mellitus without complications: Secondary | ICD-10-CM

## 2022-06-20 MED ORDER — OZEMPIC (2 MG/DOSE) 8 MG/3ML ~~LOC~~ SOPN: 2.0000 mg | PEN_INJECTOR | SUBCUTANEOUS | 3 refills | Status: DC

## 2022-07-05 ENCOUNTER — Encounter: Payer: Self-pay | Admitting: Pharmacist Clinician (PhC)/ Clinical Pharmacy Specialist

## 2022-07-10 DIAGNOSIS — F439 Reaction to severe stress, unspecified: Secondary | ICD-10-CM | POA: Diagnosis not present

## 2022-07-11 ENCOUNTER — Ambulatory Visit (AMBULATORY_SURGERY_CENTER): Payer: Self-pay | Admitting: *Deleted

## 2022-07-11 VITALS — Ht 67.0 in | Wt 296.0 lb

## 2022-07-11 DIAGNOSIS — Z1211 Encounter for screening for malignant neoplasm of colon: Secondary | ICD-10-CM

## 2022-07-11 DIAGNOSIS — G4733 Obstructive sleep apnea (adult) (pediatric): Secondary | ICD-10-CM | POA: Diagnosis not present

## 2022-07-11 DIAGNOSIS — Z800 Family history of malignant neoplasm of digestive organs: Secondary | ICD-10-CM

## 2022-07-11 MED ORDER — NA SULFATE-K SULFATE-MG SULF 17.5-3.13-1.6 GM/177ML PO SOLN: 1.0000 | Freq: Once | ORAL | 0 refills | Status: AC

## 2022-07-11 NOTE — Progress Notes (Signed)
No egg or soy allergy known to patient  No issues known to pt with past sedation with any surgeries or procedures Patient denies ever being told they had issues or difficulty with intubation  No FH of Malignant Hyperthermia Pt is not on diet pills Pt is not on  home 02  Pt is not on blood thinners  Pt has issues with constipation at times. No A fib or A flutter Have any cardiac testing pending--NO Pt instructed to use Singlecare.com or GoodRx for a price reduction on prep    Extra miralax given to pt. Taken with suprep.

## 2022-07-21 ENCOUNTER — Encounter: Payer: Self-pay | Admitting: Certified Registered Nurse Anesthetist

## 2022-07-26 ENCOUNTER — Telehealth: Payer: Self-pay | Admitting: Internal Medicine

## 2022-07-26 NOTE — Telephone Encounter (Signed)
Went over prep instructions with pt and answered all questions at this time. Suprep instructions sent to my chart pt aware. Also explained the covid + policy because c/o sinus congestion at this time but no fever and - coivd test per pt.

## 2022-07-26 NOTE — Telephone Encounter (Signed)
Patient called states she lost her prep instruction and would like a nurse to call her to explain things to her. Please call to advise.

## 2022-07-29 ENCOUNTER — Ambulatory Visit (AMBULATORY_SURGERY_CENTER): Payer: Federal, State, Local not specified - PPO | Admitting: Internal Medicine

## 2022-07-29 ENCOUNTER — Encounter: Payer: Self-pay | Admitting: Internal Medicine

## 2022-07-29 VITALS — BP 114/73 | HR 68 | Temp 96.8°F | Resp 14 | Ht 67.0 in | Wt 291.6 lb

## 2022-07-29 DIAGNOSIS — Z1211 Encounter for screening for malignant neoplasm of colon: Secondary | ICD-10-CM | POA: Diagnosis not present

## 2022-07-29 DIAGNOSIS — Z800 Family history of malignant neoplasm of digestive organs: Secondary | ICD-10-CM

## 2022-07-29 MED ORDER — SODIUM CHLORIDE 0.9 % IV SOLN: 500.0000 mL | Freq: Once | INTRAVENOUS | Status: DC

## 2022-07-29 NOTE — Progress Notes (Signed)
Pt's states no medical or surgical changes since previsit or office visit. 

## 2022-07-29 NOTE — Patient Instructions (Signed)
  HANDOUTS ON DIVERTICULOSIS AND HEMORRHOIDS GIVEN.    YOU HAD AN ENDOSCOPIC PROCEDURE TODAY AT [facility]:   Refer to the procedure report that was given to you for any specific questions about what was found during the examination.  If the procedure report does not answer your questions, please call your gastroenterologist to clarify.  If you requested that your care partner not be given the details of your procedure findings, then the procedure report has been included in a sealed envelope for you to review at your convenience later.  YOU SHOULD EXPECT: Some feelings of bloating in the abdomen. Passage of more gas than usual.  Walking can help get rid of the air that was put into your GI tract during the procedure and reduce the bloating. If you had a lower endoscopy (such as a colonoscopy or flexible sigmoidoscopy) you may notice spotting of blood in your stool or on the toilet paper. If you underwent a bowel prep for your procedure, you may not have a normal bowel movement for a few days.  Please Note:  You might notice some irritation and congestion in your nose or some drainage.  This is from the oxygen used during your procedure.  There is no need for concern and it should clear up in a day or so.  SYMPTOMS TO REPORT IMMEDIATELY:  Following lower endoscopy (colonoscopy or flexible sigmoidoscopy):  Excessive amounts of blood in the stool  Significant tenderness or worsening of abdominal pains  Swelling of the abdomen that is new, acute  Fever of 100F or higher  For urgent or emergent issues, a gastroenterologist can be reached at any hour by calling [phone]. Do not use MyChart messaging for urgent concerns.    DIET:  We do recommend a small meal at first, but then you may proceed to your regular diet.  Drink plenty of fluids but you should avoid alcoholic beverages for 24 hours.  ACTIVITY:  You should plan to take it easy for the rest of today and you should  NOT DRIVE or use heavy machinery until tomorrow (because of the sedation medicines used during the test).    FOLLOW UP: Our staff will call the number listed on your records the next business day following your procedure.  We will call around 7:15- 8:00 am to check on you and address any questions or concerns that you may have regarding the information given to you following your procedure. If we do not reach you, we will leave a message.     If any biopsies were taken you will be contacted by phone or by letter within the next 1-3 weeks.  Please call us at [phone] if you have not heard about the biopsies in 3 weeks.    SIGNATURES/CONFIDENTIALITY: You and/or your care partner have signed paperwork which will be entered into your electronic medical record.  These signatures attest to the fact that that the information above on your After Visit Summary has been reviewed and is understood.  Full responsibility of the confidentiality of this discharge information lies with you and/or your care-partner.

## 2022-07-29 NOTE — Op Note (Signed)
[facility] Patient Name: Susan Sutton Procedure Date: 07/29/2022 8:03 AM MRN: [id number] Endoscopist: Nicole Kindred "Susan Sutton ,  Age: 51 Referring MD:  Date of Birth: 10/25/1971 Gender: Female Account #: [id number] Procedure:                Colonoscopy Indications:              Screening for colorectal malignant neoplasm, This                            is the patient's first colonoscopy Medicines:                Monitored Anesthesia Care Procedure:                Pre-Anesthesia Assessment:                           - Prior to the procedure, a History and Physical                            was performed, and patient medications and                            allergies were reviewed. The patient's tolerance of                            previous anesthesia was also reviewed. The risks                            and benefits of the procedure and the sedation                            options and risks were discussed with the patient.                            All questions were answered, and informed consent                            was obtained. Prior Anticoagulants: The patient has                            taken no previous anticoagulant or antiplatelet                            agents. ASA Grade Assessment: III - A patient with                            severe systemic disease. After reviewing the risks                            and benefits, the patient was deemed in                            satisfactory condition to undergo the procedure.  After obtaining informed consent, the colonoscope                            was passed under direct vision. Throughout the                            procedure, the patient's blood pressure, pulse, and                            oxygen saturations were monitored continuously. The                            CF [id number] #[id number] was introduced through the anus                            and advanced to the  the terminal ileum. The                            colonoscopy was performed without difficulty. The                            patient tolerated the procedure well. The quality                            of the bowel preparation was good. The terminal                            ileum, ileocecal valve, appendiceal orifice, and                            rectum were photographed. Scope In: 8:18:22 AM Scope Out: 8:40:13 AM Scope Withdrawal Time: 0 hours 15 minutes 39 seconds  Total Procedure Duration: 0 hours 21 minutes 51 seconds  Findings:                 The terminal ileum appeared normal.                           Small and large-mouthed diverticula were found in                            the sigmoid colon and descending colon.                           Non-bleeding internal hemorrhoids were found during                            retroflexion. Complications:            No immediate complications. Estimated Blood Loss:     Estimated blood loss was minimal. Impression:               - The examined portion of the ileum was normal.                           - Diverticulosis in the  sigmoid colon and in the                            descending colon.                           - Non-bleeding internal hemorrhoids.                           - No specimens collected. Recommendation:           - Discharge patient to home (with escort).                           - Repeat colonoscopy in 10 years for screening                            purposes.                           - The findings and recommendations were discussed                            with the patient. Dr Susan Sutton "Susan Sutton" Susan Sutton,  07/29/2022 8:47:33 AM

## 2022-07-29 NOTE — Progress Notes (Signed)
GASTROENTEROLOGY PROCEDURE H&P NOTE   Primary Care Physician: Vonna Drafts, FNP    Reason for Procedure:   Colon cancer screening  Plan:    Colonoscopy  Patient is appropriate for endoscopic procedure(s) in the ambulatory ([place]) setting.  The nature of the procedure, as well as the risks, benefits, and alternatives were carefully and thoroughly reviewed with the patient. Ample time for discussion and questions allowed. The patient understood, was satisfied, and agreed to proceed.     HPI: Susan Sutton is a 51 y.o. female who presents for colonoscopy for colon cancer screening. Denies blood in stools, changes in bowel habits, weight loss. Has an uncle with colon cancer.  Past Medical History:  Diagnosis Date   Anemia    Angina pectoris ([place]) 11/23/2020   Anxiety    LAST 2-3 MONTHS,D/T WORK   Essential hypertension 11/23/2020   Hepatitis    "b"    Morbid obesity ([place]) 11/23/2020   OSA (obstructive sleep apnea) 07/13/2021   Pre-diabetes    diet controlled - no meds- does not check blood sugar   Sleep apnea    C PAP   Snoring 07/13/2021    Past Surgical History:  Procedure Laterality Date   DILATATION & CURRETTAGE/HYSTEROSCOPY WITH RESECTOCOPE N/A 01/19/2021   Procedure: [place];  Surgeon: Servando Salina, MD;  Location: [place];  Service: Gynecology;  Laterality: N/A;   KNEE ARTHROSCOPY Right 08/22/2014   Procedure: ARTHROSCOPY KNEE;  Surgeon: Vickey Huger, MD;  Location: [place];  Service: Orthopedics;  Laterality: Right;  Partial medial menisectomy   WISDOM TOOTH EXTRACTION      Prior to Admission medications   Medication Sig Start Date End Date Taking? Authorizing Provider  amLODipine (NORVASC) 5 MG tablet Take 1 tablet (5 mg total) by mouth daily. 11/23/21  Yes Skeet Latch, MD  chlorthalidone (HYGROTON) 25 MG tablet Take 1/2 (one-half) tablet by mouth once daily 02/12/22  Yes Skeet Latch, MD  metoprolol  succinate (TOPROL-XL) 50 MG 24 hr tablet Take 0.5 tablets (25 mg total) by mouth daily. Take with or immediately following a meal. 12/29/20 03/18/23 Yes Skeet Latch, MD  acetaminophen (TYLENOL) 650 MG CR tablet Take 650 mg by mouth every 8 (eight) hours as needed for pain.    [provider]  Semaglutide, 2 MG/DOSE, (OZEMPIC, 2 MG/DOSE,) 8 MG/3ML SOPN Inject 2 mg into the skin every 7 (seven) days. 06/20/22   Skeet Latch, MD    Current Outpatient Medications  Medication Sig Dispense Refill   amLODipine (NORVASC) 5 MG tablet Take 1 tablet (5 mg total) by mouth daily. 90 tablet 3   chlorthalidone (HYGROTON) 25 MG tablet Take 1/2 (one-half) tablet by mouth once daily 45 tablet 3   metoprolol succinate (TOPROL-XL) 50 MG 24 hr tablet Take 0.5 tablets (25 mg total) by mouth daily. Take with or immediately following a meal. 90 tablet 3   acetaminophen (TYLENOL) 650 MG CR tablet Take 650 mg by mouth every 8 (eight) hours as needed for pain.     Semaglutide, 2 MG/DOSE, (OZEMPIC, 2 MG/DOSE,) 8 MG/3ML SOPN Inject 2 mg into the skin every 7 (seven) days. 3 mL 3   Current Facility-Administered Medications  Medication Dose Route Frequency Provider Last Rate Last Admin   0.9 %  sodium chloride infusion  500 mL Intravenous Once Sharyn Creamer, MD        Allergies as of 07/29/2022   (No Known Allergies)    Family History  Problem  Relation Age of Onset   Heart attack Mother    Hypertension Mother    Congestive Heart Failure Mother    Diabetes Mother    Stroke Mother    Diabetes Father    Hypertension Father    Heart attack Father    Congestive Heart Failure Father    Crohn's disease Sister    Diabetes Sister    Hypertension Sister    Hyperlipidemia Sister    Lupus Sister    Colon polyps Sister    Hypertension Brother    Breast cancer Paternal Aunt    Lung cancer Paternal Aunt    Esophageal cancer Paternal Uncle    Colon cancer Paternal Uncle    Hypertension Maternal  Grandmother    Diabetes Maternal Grandmother    Epilepsy Maternal Grandmother    Alzheimer's disease Maternal Grandmother    Breast cancer Paternal Grandmother     Social History   Socioeconomic History   Marital status: Single    Spouse name: Not on file   Number of children: Not on file   Years of education: Not on file   Highest education level: Not on file  Occupational History   Not on file  Tobacco Use   Smoking status: Never    Passive exposure: Current (AUNT)   Smokeless tobacco: Never  Vaping Use   Vaping Use: Never used  Substance and Sexual Activity   Alcohol use: Yes    Comment: occ   Drug use: No   Sexual activity: Yes    Birth control/protection: Condom  Other Topics Concern   Not on file  Social History Narrative   Not on file   Social Determinants of Health   Financial Resource Strain: Low Risk  (07/13/2021)   Overall Financial Resource Strain (CARDIA)    Difficulty of Paying Living Expenses: Not hard at all  Food Insecurity: No Food Insecurity (07/13/2021)   Hunger Vital Sign    Worried About Running Out of Food in the Last Year: Never true    Ran Out of Food in the Last Year: Never true  Transportation Needs: No Transportation Needs (07/13/2021)   PRAPARE - Administrator, Civil Service (Medical): No    Lack of Transportation (Non-Medical): No  Physical Activity: Insufficiently Active (07/13/2021)   Exercise Vital Sign    Days of Exercise per Week: 2 days    Minutes of Exercise per Session: 30 min  Stress: Not on file  Social Connections: Not on file  Intimate Partner Violence: Not on file    Physical Exam: Vital signs in last 24 hours: BP 136/73   Pulse 82   Temp (!) 96.8 F (36 C)   Ht 5\' 7"  (1.702 m)   Wt 291 lb 9.6 oz (132.3 kg)   SpO2 97%   BMI 45.67 kg/m  GEN: NAD EYE: Sclerae anicteric ENT: MMM CV: Non-tachycardic Pulm: No increased work of breathing GI: Soft, NT/ND NEURO:  Alert & Oriented   ,  MD [facility]  07/29/2022 8:03 AM

## 2022-07-29 NOTE — Progress Notes (Signed)
Report given to PACU, vss 

## 2022-07-30 ENCOUNTER — Telehealth: Payer: Self-pay

## 2022-07-30 NOTE — Telephone Encounter (Signed)
  Follow up Call-     07/29/2022    7:44 AM 07/29/2022    7:37 AM  Call back number  Post procedure Call Back phone  # [phone]  Permission to leave phone message Yes Yes     Patient questions:  Do you have a fever, pain , or abdominal swelling? No. Pain Score  0 *  Have you tolerated food without any problems? Yes.    Have you been able to return to your normal activities? Yes.    Do you have any questions about your discharge instructions: Diet   No. Medications  No. Follow up visit  No.  Do you have questions or concerns about your Care? No.  Actions: * If pain score is 4 or above: No action needed, pain <4.

## 2022-08-10 DIAGNOSIS — G4733 Obstructive sleep apnea (adult) (pediatric): Secondary | ICD-10-CM | POA: Diagnosis not present

## 2022-09-03 ENCOUNTER — Encounter: Payer: Self-pay | Admitting: Pharmacist Clinician (PhC)/ Clinical Pharmacy Specialist

## 2022-09-03 ENCOUNTER — Encounter (HOSPITAL_BASED_OUTPATIENT_CLINIC_OR_DEPARTMENT_OTHER): Payer: Self-pay | Admitting: Cardiovascular Disease

## 2022-09-05 DIAGNOSIS — F439 Reaction to severe stress, unspecified: Secondary | ICD-10-CM | POA: Diagnosis not present

## 2022-09-10 DIAGNOSIS — G4733 Obstructive sleep apnea (adult) (pediatric): Secondary | ICD-10-CM | POA: Diagnosis not present

## 2022-10-07 ENCOUNTER — Encounter (HOSPITAL_BASED_OUTPATIENT_CLINIC_OR_DEPARTMENT_OTHER): Payer: Self-pay | Admitting: Cardiovascular Disease

## 2022-10-07 ENCOUNTER — Ambulatory Visit (HOSPITAL_BASED_OUTPATIENT_CLINIC_OR_DEPARTMENT_OTHER): Payer: Federal, State, Local not specified - PPO | Admitting: Cardiovascular Disease

## 2022-10-07 VITALS — BP 110/70 | HR 66 | Ht 67.0 in | Wt 281.2 lb

## 2022-10-07 DIAGNOSIS — Z5181 Encounter for therapeutic drug level monitoring: Secondary | ICD-10-CM | POA: Diagnosis not present

## 2022-10-07 DIAGNOSIS — G4733 Obstructive sleep apnea (adult) (pediatric): Secondary | ICD-10-CM

## 2022-10-07 DIAGNOSIS — I10 Essential (primary) hypertension: Secondary | ICD-10-CM | POA: Diagnosis not present

## 2022-10-07 MED ORDER — AMLODIPINE BESYLATE 2.5 MG PO TABS: 2.5000 mg | ORAL_TABLET | Freq: Every day | ORAL | 3 refills | Status: DC

## 2022-10-07 NOTE — Patient Instructions (Signed)
Medication Instructions:  DECREASE YOUR AMLODIPINE TO 2.5 MG DAILY  MONITOR YOUR BLOOD PRESSURE AT HOME AND CALL THE OFFICE IF YOUR BLOOD PRESSURE IS NOT CONSISTENTLY BELOW 130   *If you need a refill on your cardiac medications before your next appointment, please call your pharmacy*  Lab Work: FASTING LP/CMET/A1C/CBC SOON   If you have labs (blood work) drawn today and your tests are completely normal, you will receive your results only by: MyChart Message (if you have MyChart) OR A paper copy in the mail If you have any lab test that is abnormal or we need to change your treatment, we will call you to review the results.  Testing/Procedures: NONE  Follow-Up: At [facility], you and your health needs are our priority.  As part of our continuing mission to provide you with exceptional heart care, we have created designated Provider Care Teams.  These Care Teams include your primary Cardiologist (physician) and Advanced Practice Providers (APPs -  Physician Assistants and Nurse Practitioners) who all work together to provide you with the care you need, when you need it.  We recommend signing up for the patient portal called "MyChart".  Sign up information is provided on this After Visit Summary.  MyChart is used to connect with patients for Virtual Visits (Telemedicine).  Patients are able to view lab/test results, encounter notes, upcoming appointments, etc.  Non-urgent messages can be sent to your provider as well.   To learn more about what you can do with MyChart, go to [email or web address].    Your next appointment:   6 month(s)  The format for your next appointment:   In Person  Provider:   Chilton Si, MD

## 2022-10-07 NOTE — Progress Notes (Signed)
Advanced Hypertension Follow-Up   Date:  10/24/2022   ID:  Susan Sutton, DOB 12/23/1970, MRN [id number]  PCP:  Diamantina Providence, FNP  Cardiologist:  None  Nephrologist:  Referring MD: Diamantina Providence, FNP   CC: Hypertension  History of Present Illness:    Susan Sutton is a 51 y.o. female with hypertension, morbid obesity, NSVT, OSA on CPAP, and diabetes here for advanced hypertension follow-up. She was initially seen 1/22 for hypertension. She has been working form home due to the pandemic. She saw Dr. Cherly Hensen on 09/2020 and her BP was 160/88.  She has gained a lot of weight since working from home.  Ms. Jian previously saw cardiologist through [facility] for palpitations.  She wore an ambulatory monitor that showed 4 runs of NSVT with rare PVCs and PACs.  She was started on metoprolol 09/2020.  She did not notice much change in her symptoms.  Despite this her blood pressure has continued to be elevated.  At home it has been running in the 150s to 170s over 80s to 90s.  She mostly cooks at home.  She is using air Eunice Blase and switch to [employer] because she heard this was better.  She drinks 1 to 212 ounce coffees daily.  She was drinking up to a gallon of sweet tea.  She is adding Trulicity to her coffee instead of sugar.  She is trying to slowly cut back on how much tea she drinks.  She does snore and has some daytime somnolence, though she attributes this to her job being boring.  She has chronic pain from sciatica and takes ibuprofen at least once or twice daily for either her back pain or menstrual cramps.  She notes a strong family history of heart disease in the women in her family. She started on Amlodipine but developed swelling but switched to Chlorthalidone. She enrolled in the PREP program and really enjoyed it.  She was started on Ozempic 11/23/21.   At the last visit, her blood pressure remained well-controlled and she was referred to dermatology for hyperpigmentation of the face.   Lately she has been doing well and has seen great progress. She noticed that she has more energy and rest since her last visit.  She denied any LE edema, palpitations, lightheadedness, or dizziness. She hasn't been checking here blood pressure as much as she should. However when she does, her blood pressure averages 120s systolic and 70s diastolic. She has been trying to walk for her physical activity, which she feels well upon exertion. She has been giving care to her aunt which has been giving her some stress. She noted that she has lost 40lbs, her weight goal is 250lbs.  Past Medical History:  Diagnosis Date   Anemia    Angina pectoris ([facility]) 11/23/2020   Anxiety    LAST 2-3 MONTHS,D/T WORK   Essential hypertension 11/23/2020   Hepatitis    "b"    Morbid obesity ([facility]) 11/23/2020   OSA (obstructive sleep apnea) 07/13/2021   Pre-diabetes    diet controlled - no meds- does not check blood sugar   Sleep apnea    C PAP   Snoring 07/13/2021    Past Surgical History:  Procedure Laterality Date   DILATATION & CURRETTAGE/HYSTEROSCOPY WITH RESECTOCOPE N/A 01/19/2021   Procedure: DILATATION & CURETTAGE/HYSTEROSCOPY WITH MYOSURE;  Surgeon: Maxie Better, MD;  Location: [facility] OR;  Service: Gynecology;  Laterality: N/A;   KNEE ARTHROSCOPY Right 08/22/2014   Procedure: ARTHROSCOPY KNEE;  Surgeon: Dannielle Huh, MD;  Location: [facility] OR;  Service: Orthopedics;  Laterality: Right;  Partial medial menisectomy   WISDOM TOOTH EXTRACTION      Current Medications: Current Meds  Medication Sig   acetaminophen (TYLENOL) 650 MG CR tablet Take 650 mg by mouth every 8 (eight) hours as needed for pain.   chlorthalidone (HYGROTON) 25 MG tablet Take 1/2 (one-half) tablet by mouth once daily   metoprolol succinate (TOPROL-XL) 50 MG 24 hr tablet Take 0.5 tablets (25 mg total) by mouth daily. Take with or immediately following a meal.   Semaglutide, 2 MG/DOSE, (OZEMPIC, 2 MG/DOSE,) 8 MG/3ML SOPN Inject 2 mg into the  skin every 7 (seven) days.   [DISCONTINUED] amLODipine (NORVASC) 5 MG tablet Take 1 tablet (5 mg total) by mouth daily.     Allergies:   Patient has no known allergies.   Social History   Socioeconomic History   Marital status: Single    Spouse name: Not on file   Number of children: Not on file   Years of education: Not on file   Highest education level: Not on file  Occupational History   Not on file  Tobacco Use   Smoking status: Never    Passive exposure: Current (AUNT)   Smokeless tobacco: Never  Vaping Use   Vaping Use: Never used  Substance and Sexual Activity   Alcohol use: Yes    Comment: occ   Drug use: No   Sexual activity: Yes    Birth control/protection: Condom  Other Topics Concern   Not on file  Social History Narrative   Not on file   Social Determinants of Health   Financial Resource Strain: Low Risk  (07/13/2021)   Overall Financial Resource Strain (CARDIA)    Difficulty of Paying Living Expenses: Not hard at all  Food Insecurity: No Food Insecurity (07/13/2021)   Hunger Vital Sign    Worried About Running Out of Food in the Last Year: Never true    Ran Out of Food in the Last Year: Never true  Transportation Needs: No Transportation Needs (07/13/2021)   PRAPARE - Administrator, Civil Service (Medical): No    Lack of Transportation (Non-Medical): No  Physical Activity: Insufficiently Active (07/13/2021)   Exercise Vital Sign    Days of Exercise per Week: 2 days    Minutes of Exercise per Session: 30 min  Stress: Not on file  Social Connections: Not on file     Family History: The patient's family history includes Alzheimer's disease in her maternal grandmother; Breast cancer in her paternal aunt and paternal grandmother; Colon cancer in her paternal uncle; Colon polyps in her sister; Congestive Heart Failure in her father and mother; Crohn's disease in her sister; Diabetes in her father, maternal grandmother, mother, and sister; Epilepsy in  her maternal grandmother; Esophageal cancer in her paternal uncle; Heart attack in her father and mother; Hyperlipidemia in her sister; Hypertension in her brother, father, maternal grandmother, mother, and sister; Lung cancer in her paternal aunt; Lupus in her sister; Stroke in her mother.  ROS:   Please see the history of present illness.  All other systems are reviewed and negative.    EKGs/Labs/Other Studies Reviewed:    EKG: EKG is personally reviewed.  10/07/22: Sinus rhythm. Rate 66 bpm. Low-voltage. 9/22: Sinus rhythm  71 bpm.  1/22:Sinus rhythm.  Rate 73 bpm.  Recent Labs: 10/18/2022: ALT 14; BUN 9; Creatinine, Ser 0.97; Hemoglobin 13.1; Platelets 312; Potassium 3.3;  Sodium 141   07/05/19: Total cholesterol 151, triglycerides 62, HDL 51, LDL 87  Recent Lipid Panel    Component Value Date/Time   CHOL 159 10/18/2022 1110   TRIG 51 10/18/2022 1110   HDL 56 10/18/2022 1110   CHOLHDL 2.8 10/18/2022 1110   LDLCALC 92 10/18/2022 1110    Physical Exam:    VS:  BP 110/70 (BP Location: Left Arm, Patient Position: Sitting, Cuff Size: Large)   Pulse 66   Ht 5\' 7"  (1.702 m)   Wt 281 lb 3.2 oz (127.6 kg)   BMI 44.04 kg/m  , BMI Body mass index is 44.04 kg/m. GENERAL:  Well appearing HEENT: Pupils equal round and reactive, fundi not visualized, oral mucosa unremarkable NECK:  No jugular venous distention, waveform within normal limits, carotid upstroke brisk and symmetric, no bruits LUNGS:  Clear to auscultation bilaterally HEART:  RRR.  PMI not displaced or sustained,S1 and S2 within normal limits, no S3, no S4, no clicks, no rubs, no murmurs ABD:  Flat, positive bowel sounds normal in frequency in pitch, no bruits, no rebound, no guarding, no midline pulsatile mass, no hepatomegaly, no splenomegaly EXT:  2 plus pulses throughout, no edema, no cyanosis no clubbing SKIN:  hyperpigmentation on lower anterior tibia NEURO:  Cranial nerves II through XII grossly intact, motor  grossly intact throughout PSYCH:  Cognitively intact, oriented to person place and time  ASSESSMENT:    1. Essential hypertension   2. Morbid obesity (HCC)   3. Therapeutic drug monitoring   4. OSA (obstructive sleep apnea)     PLAN:    OSA (obstructive sleep apnea) Continue CPAP.  Essential hypertension Blood pressure has been well-controlled.  She is working on exercise. Continue amlodipine, chlorthalidone and metoprolol.  Morbid obesity (HCC) She has been working on diet and exericse.  She is having success with weight loss with this and Ozempic.   Disposition:    FU with Jolie Strohecker C. , MD, [facility] in 52-months.   Medication Adjustments/Labs and Tests Ordered: Current medicines are reviewed at length with the patient today.  Concerns regarding medicines are outlined above.  Orders Placed This Encounter  Procedures   CBC with Differential/Platelet   Lipid panel   Comprehensive metabolic panel   HgB A1c   EKG 12-Lead     Meds ordered this encounter  Medications   amLODipine (NORVASC) 2.5 MG tablet    Sig: Take 1 tablet (2.5 mg total) by mouth daily.    Dispense:  90 tablet    Refill:  3    I,Danny Valdes,acting as a 8-month for Neurosurgeon, MD.,have documented all relevant documentation on the behalf of [employer], MD,as directed by  Chilton Si, MD while in the presence of Chilton Si, MD.

## 2022-10-08 ENCOUNTER — Encounter (HOSPITAL_BASED_OUTPATIENT_CLINIC_OR_DEPARTMENT_OTHER): Payer: Self-pay | Admitting: Cardiovascular Disease

## 2022-10-14 DIAGNOSIS — F439 Reaction to severe stress, unspecified: Secondary | ICD-10-CM | POA: Diagnosis not present

## 2022-10-18 ENCOUNTER — Encounter (HOSPITAL_BASED_OUTPATIENT_CLINIC_OR_DEPARTMENT_OTHER): Payer: Self-pay | Admitting: Cardiovascular Disease

## 2022-10-18 DIAGNOSIS — Z5181 Encounter for therapeutic drug level monitoring: Secondary | ICD-10-CM | POA: Diagnosis not present

## 2022-10-18 DIAGNOSIS — I10 Essential (primary) hypertension: Secondary | ICD-10-CM | POA: Diagnosis not present

## 2022-10-19 LAB — CBC WITH DIFFERENTIAL/PLATELET
Basophils Absolute: 0 10*3/uL (ref 0.0–0.2)
Basos: 1 %
EOS (ABSOLUTE): 0.1 10*3/uL (ref 0.0–0.4)
Eos: 2 %
Hematocrit: 39.4 % (ref 34.0–46.6)
Hemoglobin: 13.1 g/dL (ref 11.1–15.9)
Immature Grans (Abs): 0 10*3/uL (ref 0.0–0.1)
Immature Granulocytes: 0 %
Lymphocytes Absolute: 2.4 10*3/uL (ref 0.7–3.1)
Lymphs: 52 %
MCH: 27.6 pg (ref 26.6–33.0)
MCHC: 33.2 g/dL (ref 31.5–35.7)
MCV: 83 fL (ref 79–97)
Monocytes Absolute: 0.4 10*3/uL (ref 0.1–0.9)
Monocytes: 9 %
Neutrophils Absolute: 1.6 10*3/uL (ref 1.4–7.0)
Neutrophils: 36 %
Platelets: 312 10*3/uL (ref 150–450)
RBC: 4.74 x10E6/uL (ref 3.77–5.28)
RDW: 12.8 % (ref 11.7–15.4)
WBC: 4.5 10*3/uL (ref 3.4–10.8)

## 2022-10-19 LAB — COMPREHENSIVE METABOLIC PANEL
ALT: 14 IU/L (ref 0–32)
AST: 17 IU/L (ref 0–40)
Albumin/Globulin Ratio: 1.2 (ref 1.2–2.2)
Albumin: 3.9 g/dL (ref 3.9–4.9)
Alkaline Phosphatase: 145 IU/L — ABNORMAL HIGH (ref 44–121)
BUN/Creatinine Ratio: 9 (ref 9–23)
BUN: 9 mg/dL (ref 6–24)
Bilirubin Total: 0.4 mg/dL (ref 0.0–1.2)
CO2: 26 mmol/L (ref 20–29)
Calcium: 9.2 mg/dL (ref 8.7–10.2)
Chloride: 102 mmol/L (ref 96–106)
Creatinine, Ser: 0.97 mg/dL (ref 0.57–1.00)
Globulin, Total: 3.3 g/dL (ref 1.5–4.5)
Glucose: 89 mg/dL (ref 70–99)
Potassium: 3.3 mmol/L — ABNORMAL LOW (ref 3.5–5.2)
Sodium: 141 mmol/L (ref 134–144)
Total Protein: 7.2 g/dL (ref 6.0–8.5)
eGFR: 71 mL/min/{1.73_m2} (ref >59)

## 2022-10-19 LAB — LIPID PANEL
Chol/HDL Ratio: 2.8 ratio (ref 0.0–4.4)
Cholesterol, Total: 159 mg/dL (ref 100–199)
HDL: 56 mg/dL (ref >39)
LDL Chol Calc (NIH): 92 mg/dL (ref 0–99)
Triglycerides: 51 mg/dL (ref 0–149)
VLDL Cholesterol Cal: 11 mg/dL (ref 5–40)

## 2022-10-19 LAB — HEMOGLOBIN A1C
Est. average glucose Bld gHb Est-mCnc: 126 mg/dL
Hgb A1c MFr Bld: 6 % — ABNORMAL HIGH (ref 4.8–5.6)

## 2022-10-24 ENCOUNTER — Encounter (HOSPITAL_BASED_OUTPATIENT_CLINIC_OR_DEPARTMENT_OTHER): Payer: Self-pay | Admitting: Cardiovascular Disease

## 2022-10-24 NOTE — Assessment & Plan Note (Signed)
Blood pressure has been well-controlled.  She is working on exercise. Continue amlodipine, chlorthalidone and metoprolol.

## 2022-10-24 NOTE — Assessment & Plan Note (Signed)
Continue CPAP.  

## 2022-10-24 NOTE — Assessment & Plan Note (Addendum)
She has been working on diet and exericse.  She is having success with weight loss with this and Ozempic. Check fasting lipids and CMP.  Check hgb A1c.

## 2022-11-05 ENCOUNTER — Telehealth (HOSPITAL_BASED_OUTPATIENT_CLINIC_OR_DEPARTMENT_OTHER): Payer: Self-pay | Admitting: *Deleted

## 2022-11-05 DIAGNOSIS — I10 Essential (primary) hypertension: Secondary | ICD-10-CM

## 2022-11-05 DIAGNOSIS — E876 Hypokalemia: Secondary | ICD-10-CM

## 2022-11-05 DIAGNOSIS — Z5181 Encounter for therapeutic drug level monitoring: Secondary | ICD-10-CM

## 2022-11-05 MED ORDER — POTASSIUM CHLORIDE CRYS ER 20 MEQ PO TBCR: 20.0000 meq | EXTENDED_RELEASE_TABLET | Freq: Two times a day (BID) | ORAL | 1 refills | Status: DC

## 2022-11-05 NOTE — Telephone Encounter (Signed)
Advised patient of lab results, order for repeat labs placed, and Rx for Potassium sent to [facility]

## 2022-11-05 NOTE — Telephone Encounter (Signed)
-----   Message from Skeet Latch, MD sent at 11/02/2022  4:15 PM EST ----- Kidney and liver function are normal.  Blood counts are normal.   Potassium is a little low.  This is because of the chlorthalidone.  Recommend a potassium supplement.  20 mEq daily.  BMP in a couple weeks.  Cholesterol levels look good.

## 2022-11-08 ENCOUNTER — Other Ambulatory Visit: Payer: Self-pay | Admitting: Cardiovascular Disease

## 2022-11-08 NOTE — Telephone Encounter (Signed)
Please review for refill. Thank you! 

## 2022-11-11 ENCOUNTER — Telehealth: Payer: Self-pay | Admitting: Pharmacist

## 2022-11-11 MED ORDER — OZEMPIC (2 MG/DOSE) 8 MG/3ML ~~LOC~~ SOPN: PEN_INJECTOR | SUBCUTANEOUS | 11 refills | Status: DC

## 2022-11-11 NOTE — Telephone Encounter (Signed)
Received PA request from pharmacy for Susan Sutton. This has been submitted, Key: [id number], and approved through 11/11/23.

## 2022-11-18 DIAGNOSIS — F439 Reaction to severe stress, unspecified: Secondary | ICD-10-CM | POA: Diagnosis not present

## 2022-11-28 ENCOUNTER — Encounter (HOSPITAL_BASED_OUTPATIENT_CLINIC_OR_DEPARTMENT_OTHER): Payer: Self-pay | Admitting: *Deleted

## 2022-12-05 DIAGNOSIS — J0140 Acute pansinusitis, unspecified: Secondary | ICD-10-CM | POA: Diagnosis not present

## 2022-12-13 DIAGNOSIS — I10 Essential (primary) hypertension: Secondary | ICD-10-CM | POA: Diagnosis not present

## 2022-12-13 DIAGNOSIS — Z5181 Encounter for therapeutic drug level monitoring: Secondary | ICD-10-CM | POA: Diagnosis not present

## 2022-12-13 DIAGNOSIS — E876 Hypokalemia: Secondary | ICD-10-CM | POA: Diagnosis not present

## 2022-12-14 LAB — BASIC METABOLIC PANEL
BUN/Creatinine Ratio: 16 (ref 9–23)
BUN: 15 mg/dL (ref 6–24)
CO2: 23 mmol/L (ref 20–29)
Calcium: 9.8 mg/dL (ref 8.7–10.2)
Chloride: 101 mmol/L (ref 96–106)
Creatinine, Ser: 0.91 mg/dL (ref 0.57–1.00)
Glucose: 89 mg/dL (ref 70–99)
Potassium: 4.4 mmol/L (ref 3.5–5.2)
Sodium: 140 mmol/L (ref 134–144)
eGFR: 77 mL/min/{1.73_m2} (ref >59)

## 2023-02-11 ENCOUNTER — Other Ambulatory Visit: Payer: Self-pay | Admitting: Cardiovascular Disease

## 2023-02-11 NOTE — Telephone Encounter (Signed)
Rx request sent to pharmacy.  

## 2023-03-27 ENCOUNTER — Ambulatory Visit (HOSPITAL_BASED_OUTPATIENT_CLINIC_OR_DEPARTMENT_OTHER): Payer: Federal, State, Local not specified - PPO | Admitting: Cardiovascular Disease

## 2023-03-27 ENCOUNTER — Encounter (HOSPITAL_BASED_OUTPATIENT_CLINIC_OR_DEPARTMENT_OTHER): Payer: Self-pay | Admitting: Cardiovascular Disease

## 2023-03-27 VITALS — BP 120/77 | HR 70 | Ht 67.0 in | Wt 282.6 lb

## 2023-03-27 DIAGNOSIS — G4733 Obstructive sleep apnea (adult) (pediatric): Secondary | ICD-10-CM | POA: Diagnosis not present

## 2023-03-27 DIAGNOSIS — I10 Essential (primary) hypertension: Secondary | ICD-10-CM | POA: Diagnosis not present

## 2023-03-27 DIAGNOSIS — Z5181 Encounter for therapeutic drug level monitoring: Secondary | ICD-10-CM

## 2023-03-27 LAB — BASIC METABOLIC PANEL
BUN/Creatinine Ratio: 12 (ref 9–23)
BUN: 12 mg/dL (ref 6–24)
CO2: 24 mmol/L (ref 20–29)
Calcium: 9.6 mg/dL (ref 8.7–10.2)
Chloride: 103 mmol/L (ref 96–106)
Creatinine, Ser: 1.03 mg/dL — ABNORMAL HIGH (ref 0.57–1.00)
Glucose: 82 mg/dL (ref 70–99)
Potassium: 4.5 mmol/L (ref 3.5–5.2)
Sodium: 142 mmol/L (ref 134–144)
eGFR: 66 mL/min/{1.73_m2} (ref >59)

## 2023-03-27 NOTE — Progress Notes (Signed)
Advanced Hypertension Follow-Up   Date:  03/27/2023   ID:  Susan Sutton, DOB 07-15-1971, MRN [id number]  PCP:  Diamantina Providence, FNP  Cardiologist:  Chilton Si, MD  Nephrologist:  Referring MD: Diamantina Providence, FNP   CC: Hypertension  History of Present Illness:    Susan Sutton is a 52 y.o. female with hypertension, morbid obesity, NSVT, OSA on CPAP, and diabetes here for advanced hypertension follow-up. She was initially seen 1/22 for hypertension. She has been working form home due to the pandemic. She saw Dr. Cherly Hensen on 09/2020 and her BP was 160/88.  She gained a lot of weight since working from home.  Susan Sutton previously saw cardiologist through [facility] for palpitations.  She wore an ambulatory monitor that showed 4 runs of NSVT with rare PVCs and PACs.  She was started on metoprolol 09/2020.  She did not notice much change in her symptoms.  Despite this her blood pressure continued to be elevated.  When first seen it was running the 150s to 170s over 80s to 90s.  She started on Amlodipine but developed swelling but switched to Chlorthalidone. She enrolled in the PREP program and really enjoyed it.  She was started on Ozempic 11/23/21.  She had chest pain and was referred for coronary CT-a 12/2020 that revealed a calcium score of 0 and no CAD.  At her 10/2022 visit she was doing well and making a lot of lifestyle changes.  Blood pressure was well-controlled and she was losing weight.  Today Susan Sutton reports ongoing stress related to her job and the recent passing of her elderly aunt. She has been monitoring her blood pressure at home, with readings consistently similar to those observed during today's visit. She admits to not adhering to the prescribed dosage of two potassium pills daily, taking only one pill due to their size. Susan Sutton describes her physical activity levels as improved, aiming for 6,000 to 10,000 steps daily. She expresses significant discomfort with her CPAP machine,  leading her to discontinue its use. Susan Sutton also mentions gastrointestinal issues potentially linked to her medication, Ozempic, experiencing alternating constipation and diarrhea.  Prior antihypertensives: Amlodipine- swelling  Past Medical History:  Diagnosis Date   Anemia    Angina pectoris (HCC) 11/23/2020   Anxiety    LAST 2-3 MONTHS,D/T WORK   Essential hypertension 11/23/2020   Hepatitis    "b"    Morbid obesity (HCC) 11/23/2020   OSA (obstructive sleep apnea) 07/13/2021   Pre-diabetes    diet controlled - no meds- does not check blood sugar   Sleep apnea    C PAP   Snoring 07/13/2021    Past Surgical History:  Procedure Laterality Date   DILATATION & CURRETTAGE/HYSTEROSCOPY WITH RESECTOCOPE N/A 01/19/2021   Procedure: DILATATION & CURETTAGE/HYSTEROSCOPY WITH MYOSURE;  Surgeon: Maxie Better, MD;  Location: [facility] OR;  Service: Gynecology;  Laterality: N/A;   KNEE ARTHROSCOPY Right 08/22/2014   Procedure: ARTHROSCOPY KNEE;  Surgeon: Dannielle Huh, MD;  Location: [facility] OR;  Service: Orthopedics;  Laterality: Right;  Partial medial menisectomy   WISDOM TOOTH EXTRACTION      Current Medications: Current Meds  Medication Sig   acetaminophen (TYLENOL) 650 MG CR tablet Take 650 mg by mouth every 8 (eight) hours as needed for pain.   amLODipine (NORVASC) 2.5 MG tablet Take 1 tablet (2.5 mg total) by mouth daily.   chlorthalidone (HYGROTON) 25 MG tablet Take 0.5 tablets (12.5 mg total) by mouth daily.   metoprolol  succinate (TOPROL-XL) 50 MG 24 hr tablet Take 0.5 tablets (25 mg total) by mouth daily. Take with or immediately following a meal.   potassium chloride SA (KLOR-CON M) 20 MEQ tablet Take 1 tablet (20 mEq total) by mouth 2 (two) times daily. (Patient taking differently: Take 20 mEq by mouth daily.)   Semaglutide, 2 MG/DOSE, (OZEMPIC, 2 MG/DOSE,) 8 MG/3ML SOPN INJECT 2 MG INTO THE SKIN EVERY 7 DAYS     Allergies:   Patient has no known allergies.   Social History    Socioeconomic History   Marital status: Single    Spouse name: Not on file   Number of children: Not on file   Years of education: Not on file   Highest education level: Not on file  Occupational History   Not on file  Tobacco Use   Smoking status: Never    Passive exposure: Current (AUNT)   Smokeless tobacco: Never  Vaping Use   Vaping Use: Never used  Substance and Sexual Activity   Alcohol use: Yes    Comment: occ   Drug use: No   Sexual activity: Yes    Birth control/protection: Condom  Other Topics Concern   Not on file  Social History Narrative   Not on file   Social Determinants of Health   Financial Resource Strain: Low Risk  (07/13/2021)   Overall Financial Resource Strain (CARDIA)    Difficulty of Paying Living Expenses: Not hard at all  Food Insecurity: No Food Insecurity (07/13/2021)   Hunger Vital Sign    Worried About Running Out of Food in the Last Year: Never true    Ran Out of Food in the Last Year: Never true  Transportation Needs: No Transportation Needs (07/13/2021)   PRAPARE - Administrator, Civil Service (Medical): No    Lack of Transportation (Non-Medical): No  Physical Activity: Insufficiently Active (07/13/2021)   Exercise Vital Sign    Days of Exercise per Week: 2 days    Minutes of Exercise per Session: 30 min  Stress: Not on file  Social Connections: Not on file     Family History: The patient's family history includes Alzheimer's disease in her maternal grandmother; Breast cancer in her paternal aunt and paternal grandmother; Colon cancer in her paternal uncle; Colon polyps in her sister; Congestive Heart Failure in her father and mother; Crohn's disease in her sister; Diabetes in her father, maternal grandmother, mother, and sister; Epilepsy in her maternal grandmother; Esophageal cancer in her paternal uncle; Heart attack in her father and mother; Hyperlipidemia in her sister; Hypertension in her brother, father, maternal  grandmother, mother, and sister; Lung cancer in her paternal aunt; Lupus in her sister; Stroke in her mother.  ROS:   Please see the history of present illness.  All other systems are reviewed and negative.    EKGs/Labs/Other Studies Reviewed:    EKG: EKG is personally reviewed.  10/07/22: Sinus rhythm. Rate 66 bpm. Low-voltage. 9/22: Sinus rhythm  71 bpm.  1/22:Sinus rhythm.  Rate 73 bpm.  Recent Labs: 10/18/2022: ALT 14; Hemoglobin 13.1; Platelets 312 12/13/2022: BUN 15; Creatinine, Ser 0.91; Potassium 4.4; Sodium 140   07/05/19: Total cholesterol 151, triglycerides 62, HDL 51, LDL 87  Recent Lipid Panel    Component Value Date/Time   CHOL 159 10/18/2022 1110   TRIG 51 10/18/2022 1110   HDL 56 10/18/2022 1110   CHOLHDL 2.8 10/18/2022 1110   LDLCALC 92 10/18/2022 1110    Physical Exam:  VS:  BP 120/77 (BP Location: Left Arm, Patient Position: Sitting, Cuff Size: Large)   Pulse 70   Ht 5\' 7"  (1.702 m)   Wt 282 lb 9.6 oz (128.2 kg)   BMI 44.26 kg/m  , BMI Body mass index is 44.26 kg/m. GENERAL:  Well appearing HEENT: Pupils equal round and reactive, fundi not visualized, oral mucosa unremarkable NECK:  No jugular venous distention, waveform within normal limits, carotid upstroke brisk and symmetric, no bruits LUNGS:  Clear to auscultation bilaterally HEART:  RRR.  PMI not displaced or sustained,S1 and S2 within normal limits, no S3, no S4, no clicks, no rubs, no murmurs ABD:  Flat, positive bowel sounds normal in frequency in pitch, no bruits, no rebound, no guarding, no midline pulsatile mass, no hepatomegaly, no splenomegaly EXT:  2 plus pulses throughout, no edema, no cyanosis no clubbing SKIN:  hyperpigmentation on lower anterior tibia NEURO:  Cranial nerves II through XII grossly intact, motor grossly intact throughout PSYCH:  Cognitively intact, oriented to person place and time  ASSESSMENT:    1. Essential hypertension   2. OSA (obstructive sleep apnea)   3.  Morbid obesity (HCC)      PLAN:    # Hypertension: - Blood pressure well-controlled. Continue current medications (amlodipine, chlorthalidone, and metoprolol) and a healthy lifestyle.  # Potassium supplementation: - Hypokalemia is due to chlorthalidone - Check potassium levels with a basic metabolic panel today. - Encourage diet high in potassium. - If potassium levels are within normal range, adjust the dose to one tablet daily.  This is what she has been taking for several months.  # Sleep apnea and CPAP non-compliance: - Encourage weight loss to potentially alleviate sleep apnea symptoms. - Consider re-evaluating mask fit or alternative options if weight loss does not improve sleep apnea.  # Constipation and diarrhea: - Encourage a high fiber diet, increased water intake, and continue Miralax as needed. - Monitor and re-evaluate if symptoms persist or worsen.  # Morbid Obesity: - Encourage continued progress towards a regular walking routine and aiming for 6,000 to 10,000 steps daily. - Continue Ozempic  Disposition:    FU with Angelie Kram C. Duke Salvia, MD, [facility] in 1 year   Medication Adjustments/Labs and Tests Ordered: Current medicines are reviewed at length with the patient today.  Concerns regarding medicines are outlined above.  No orders of the defined types were placed in this encounter.    No orders of the defined types were placed in this encounter.

## 2023-03-27 NOTE — Patient Instructions (Signed)
Medication Instructions:  Your physician recommends that you continue on your current medications as directed. Please refer to the Current Medication list given to you today.   *If you need a refill on your cardiac medications before your next appointment, please call your pharmacy*  Lab Work: BMET TODAY   If you have labs (blood work) drawn today and your tests are completely normal, you will receive your results only by: MyChart Message (if you have MyChart) OR A paper copy in the mail If you have any lab test that is abnormal or we need to change your treatment, we will call you to review the results.  Testing/Procedures: NONE  Follow-Up: At [facility], you and your health needs are our priority.  As part of our continuing mission to provide you with exceptional heart care, we have created designated Provider Care Teams.  These Care Teams include your primary Cardiologist (physician) and Advanced Practice Providers (APPs -  Physician Assistants and Nurse Practitioners) who all work together to provide you with the care you need, when you need it.  We recommend signing up for the patient portal called "MyChart".  Sign up information is provided on this After Visit Summary.  MyChart is used to connect with patients for Virtual Visits (Telemedicine).  Patients are able to view lab/test results, encounter notes, upcoming appointments, etc.  Non-urgent messages can be sent to your provider as well.   To learn more about what you can do with MyChart, go to [email or web address].    Your next appointment:   12 month(s)  Provider:   Chilton Si, MD or Gillian Shields, NP

## 2023-04-06 ENCOUNTER — Encounter (HOSPITAL_BASED_OUTPATIENT_CLINIC_OR_DEPARTMENT_OTHER): Payer: Self-pay | Admitting: Cardiovascular Disease

## 2023-04-16 DIAGNOSIS — F439 Reaction to severe stress, unspecified: Secondary | ICD-10-CM | POA: Diagnosis not present

## 2023-04-22 ENCOUNTER — Other Ambulatory Visit: Payer: Self-pay | Admitting: Cardiovascular Disease

## 2023-04-22 MED ORDER — METOPROLOL SUCCINATE ER 50 MG PO TB24: 25.0000 mg | ORAL_TABLET | Freq: Every day | ORAL | 3 refills | Status: DC

## 2023-04-22 NOTE — Addendum Note (Signed)
Addended by: Sharin Grave on: 04/22/2023 04:50 PM   Modules accepted: Orders

## 2023-04-30 ENCOUNTER — Encounter (HOSPITAL_BASED_OUTPATIENT_CLINIC_OR_DEPARTMENT_OTHER): Payer: Self-pay | Admitting: Cardiovascular Disease

## 2023-05-06 DIAGNOSIS — Z113 Encounter for screening for infections with a predominantly sexual mode of transmission: Secondary | ICD-10-CM | POA: Diagnosis not present

## 2023-05-06 DIAGNOSIS — Z114 Encounter for screening for human immunodeficiency virus [HIV]: Secondary | ICD-10-CM | POA: Diagnosis not present

## 2023-05-06 DIAGNOSIS — Z01419 Encounter for gynecological examination (general) (routine) without abnormal findings: Secondary | ICD-10-CM | POA: Diagnosis not present

## 2023-05-06 DIAGNOSIS — Z1159 Encounter for screening for other viral diseases: Secondary | ICD-10-CM | POA: Diagnosis not present

## 2023-05-13 ENCOUNTER — Encounter (HOSPITAL_BASED_OUTPATIENT_CLINIC_OR_DEPARTMENT_OTHER): Payer: Self-pay | Admitting: Cardiovascular Disease

## 2023-05-13 MED ORDER — POTASSIUM CHLORIDE CRYS ER 20 MEQ PO TBCR: 20.0000 meq | EXTENDED_RELEASE_TABLET | Freq: Two times a day (BID) | ORAL | 3 refills | Status: DC

## 2023-05-16 DIAGNOSIS — Z1231 Encounter for screening mammogram for malignant neoplasm of breast: Secondary | ICD-10-CM | POA: Diagnosis not present

## 2023-05-25 ENCOUNTER — Other Ambulatory Visit: Payer: Self-pay | Admitting: Cardiovascular Disease

## 2023-06-03 DIAGNOSIS — Z13220 Encounter for screening for lipoid disorders: Secondary | ICD-10-CM | POA: Diagnosis not present

## 2023-06-03 DIAGNOSIS — Z1331 Encounter for screening for depression: Secondary | ICD-10-CM | POA: Diagnosis not present

## 2023-06-03 DIAGNOSIS — I10 Essential (primary) hypertension: Secondary | ICD-10-CM | POA: Diagnosis not present

## 2023-06-03 DIAGNOSIS — Z139 Encounter for screening, unspecified: Secondary | ICD-10-CM | POA: Diagnosis not present

## 2023-06-03 DIAGNOSIS — E669 Obesity, unspecified: Secondary | ICD-10-CM | POA: Diagnosis not present

## 2023-06-03 DIAGNOSIS — Z0000 Encounter for general adult medical examination without abnormal findings: Secondary | ICD-10-CM | POA: Diagnosis not present

## 2023-07-08 DIAGNOSIS — I10 Essential (primary) hypertension: Secondary | ICD-10-CM | POA: Diagnosis not present

## 2023-07-08 DIAGNOSIS — F430 Acute stress reaction: Secondary | ICD-10-CM | POA: Diagnosis not present

## 2023-07-29 DIAGNOSIS — F439 Reaction to severe stress, unspecified: Secondary | ICD-10-CM | POA: Diagnosis not present

## 2023-08-06 ENCOUNTER — Other Ambulatory Visit: Payer: Self-pay | Admitting: Cardiovascular Disease

## 2023-08-18 DIAGNOSIS — F439 Reaction to severe stress, unspecified: Secondary | ICD-10-CM | POA: Diagnosis not present

## 2023-09-01 ENCOUNTER — Encounter (HOSPITAL_BASED_OUTPATIENT_CLINIC_OR_DEPARTMENT_OTHER): Payer: Self-pay | Admitting: Cardiovascular Disease

## 2023-09-15 DIAGNOSIS — F439 Reaction to severe stress, unspecified: Secondary | ICD-10-CM | POA: Diagnosis not present

## 2023-10-01 ENCOUNTER — Other Ambulatory Visit (HOSPITAL_COMMUNITY): Payer: Self-pay

## 2023-10-04 DIAGNOSIS — R051 Acute cough: Secondary | ICD-10-CM | POA: Diagnosis not present

## 2023-10-04 DIAGNOSIS — R0989 Other specified symptoms and signs involving the circulatory and respiratory systems: Secondary | ICD-10-CM | POA: Diagnosis not present

## 2023-10-13 DIAGNOSIS — J029 Acute pharyngitis, unspecified: Secondary | ICD-10-CM | POA: Diagnosis not present

## 2023-10-13 DIAGNOSIS — J209 Acute bronchitis, unspecified: Secondary | ICD-10-CM | POA: Diagnosis not present

## 2023-10-13 DIAGNOSIS — R059 Cough, unspecified: Secondary | ICD-10-CM | POA: Diagnosis not present

## 2023-10-16 DIAGNOSIS — F439 Reaction to severe stress, unspecified: Secondary | ICD-10-CM | POA: Diagnosis not present

## 2023-10-21 ENCOUNTER — Telehealth: Payer: Self-pay

## 2023-10-21 ENCOUNTER — Other Ambulatory Visit (HOSPITAL_COMMUNITY): Payer: Self-pay

## 2023-10-21 NOTE — Telephone Encounter (Signed)
Pharmacy Patient Advocate Encounter   Received notification from CoverMyMeds that prior authorization for Susan Sutton is required/requested.   Insurance verification completed.   The patient is insured through [facility] .   Per test claim: PA required; PA submitted to above mentioned insurance via CoverMyMeds Key/confirmation #/EOC [id number] Status is pending

## 2023-10-23 DIAGNOSIS — K080 Exfoliation of teeth due to systemic causes: Secondary | ICD-10-CM | POA: Diagnosis not present

## 2023-10-24 ENCOUNTER — Other Ambulatory Visit (HOSPITAL_COMMUNITY): Payer: Self-pay

## 2023-10-24 NOTE — Telephone Encounter (Signed)
Pharmacy Patient Advocate Encounter  Received notification from [facility] that Prior Authorization for [facility] has been APPROVED from 09/24/23 to 10/23/24

## 2023-11-24 DIAGNOSIS — F439 Reaction to severe stress, unspecified: Secondary | ICD-10-CM | POA: Diagnosis not present

## 2023-12-03 ENCOUNTER — Other Ambulatory Visit: Payer: Self-pay | Admitting: Cardiovascular Disease

## 2023-12-03 DIAGNOSIS — E119 Type 2 diabetes mellitus without complications: Secondary | ICD-10-CM

## 2023-12-05 ENCOUNTER — Other Ambulatory Visit (HOSPITAL_BASED_OUTPATIENT_CLINIC_OR_DEPARTMENT_OTHER): Payer: Self-pay | Admitting: Cardiovascular Disease

## 2023-12-17 DIAGNOSIS — F439 Reaction to severe stress, unspecified: Secondary | ICD-10-CM | POA: Diagnosis not present

## 2024-01-14 DIAGNOSIS — F439 Reaction to severe stress, unspecified: Secondary | ICD-10-CM | POA: Diagnosis not present

## 2024-01-24 ENCOUNTER — Other Ambulatory Visit: Payer: Self-pay | Admitting: Cardiovascular Disease

## 2024-02-16 DIAGNOSIS — R509 Fever, unspecified: Secondary | ICD-10-CM | POA: Diagnosis not present

## 2024-02-16 DIAGNOSIS — U071 COVID-19: Secondary | ICD-10-CM | POA: Diagnosis not present

## 2024-02-16 DIAGNOSIS — R52 Pain, unspecified: Secondary | ICD-10-CM | POA: Diagnosis not present

## 2024-02-16 DIAGNOSIS — R5383 Other fatigue: Secondary | ICD-10-CM | POA: Diagnosis not present

## 2024-02-17 DIAGNOSIS — U071 COVID-19: Secondary | ICD-10-CM | POA: Diagnosis not present

## 2024-02-25 DIAGNOSIS — J329 Chronic sinusitis, unspecified: Secondary | ICD-10-CM | POA: Diagnosis not present

## 2024-02-25 DIAGNOSIS — J040 Acute laryngitis: Secondary | ICD-10-CM | POA: Diagnosis not present

## 2024-02-25 DIAGNOSIS — J029 Acute pharyngitis, unspecified: Secondary | ICD-10-CM | POA: Diagnosis not present

## 2024-03-18 DIAGNOSIS — F439 Reaction to severe stress, unspecified: Secondary | ICD-10-CM | POA: Diagnosis not present

## 2024-03-25 ENCOUNTER — Ambulatory Visit (HOSPITAL_BASED_OUTPATIENT_CLINIC_OR_DEPARTMENT_OTHER): Admitting: Cardiovascular Disease

## 2024-03-25 ENCOUNTER — Encounter (HOSPITAL_BASED_OUTPATIENT_CLINIC_OR_DEPARTMENT_OTHER): Payer: Self-pay | Admitting: Cardiovascular Disease

## 2024-03-25 VITALS — BP 105/68 | HR 74 | Ht 67.0 in | Wt 291.0 lb

## 2024-03-25 DIAGNOSIS — G4733 Obstructive sleep apnea (adult) (pediatric): Secondary | ICD-10-CM

## 2024-03-25 DIAGNOSIS — I10 Essential (primary) hypertension: Secondary | ICD-10-CM

## 2024-03-25 MED ORDER — METOPROLOL SUCCINATE ER 25 MG PO TB24: 25.0000 mg | ORAL_TABLET | Freq: Every day | ORAL | 3 refills | Status: AC

## 2024-03-25 NOTE — Progress Notes (Unsigned)
 Advanced Hypertension Follow-Up   Date:  03/25/2024   ID:  Susan Sutton, DOB Oct 16, 1971, MRN [id number]  PCP:  Bari Boos, FNP  Cardiologist:  Maudine Sos, MD   Referring MD: Bari Boos, FNP   CC: Hypertension  History of Present Illness:    Susan Sutton is a 53 y.o. female with hypertension, morbid obesity, NSVT, OSA not on CPAP, and diabetes here for advanced hypertension follow-up. She was initially seen 1/22 for hypertension. She saw Dr. Lesta Rater on 09/2020 and her BP was 160/88.  She gained a lot of weight since working from home.  Susan Sutton previously saw cardiologist through [name] for palpitations.  She wore an ambulatory monitor that showed 4 runs of NSVT with rare PVCs and PACs.  She was started on metoprolol  09/2020.  She did not notice much change in her symptoms.  Despite this her blood pressure continued to be elevated.  When first seen it was running the 150s to 170s over 80s to 90s.  She started on Amlodipine  but developed swelling but switched to Chlorthalidone . She enrolled in the PREP program and really enjoyed it.  She was started on Ozempic  11/23/21.  She had chest pain and was referred for coronary CT-a 12/2020 that revealed a calcium score of 0 and no CAD.  At her 10/2022 visit she was doing well and making a lot of lifestyle changes.  Blood pressure was well-controlled and she was losing weight.  At her visit 03/2023 she was doing well and exercising more.   Discussed the use of AI scribe software for clinical note transcription with the patient, who gave verbal consent to proceed.  History of Present Illness Susan Sutton has experienced a plateau in her weight loss with a slight recent increase, attributed to a hectic schedule and poor dietary habits, such as eating on the go and not meal prepping. 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  She experiences numbness in her hands throughout the day and night, causing her to frequently shake her hands to relieve the sensation. She has also noticed arm pain, described as a pressure point that is tender and sore, but relieved by applying pressure.  She is currently taking amlodipine , chlorthalidone , and a potassium supplement. She has not been using her CPAP machine due to discomfort. She recently had COVID-19 in April 2025 and was prescribed Paxlovid at a lower dose due to concerns about her kidney or liver function. She has not taken metoprolol  for over two weeks due to running out of the medication and not realizing it.  She reports gastrointestinal issues as a side effect of Ozempic , which has led to increased bathroom use. This has been challenging due to workplace restrictions on bathroom breaks.  Her blood pressure readings have been between 100-120/60-80 mmHg, despite missing some doses of her medication.  Prior antihypertensives: Amlodipine - swelling  Past Medical History:  Diagnosis Date   Anemia    Angina pectoris (HCC) 11/23/2020   Anxiety    LAST 2-3 MONTHS,D/T WORK   Essential hypertension 11/23/2020   Hepatitis    "b"    Morbid obesity (HCC) 11/23/2020   OSA (obstructive sleep apnea) 07/13/2021   Pre-diabetes    diet controlled - no meds- does not check blood sugar   Sleep apnea    C PAP   Snoring 07/13/2021  Past Surgical History:  Procedure Laterality Date   DILATATION & CURRETTAGE/HYSTEROSCOPY WITH RESECTOCOPE N/A 01/19/2021   Procedure: DILATATION & CURETTAGE/HYSTEROSCOPY WITH MYOSURE;  Surgeon: Ivery Marking, MD;  Location: [facility] OR;  Service: Gynecology;  Laterality: N/A;   KNEE ARTHROSCOPY Right 08/22/2014   Procedure: ARTHROSCOPY KNEE;  Surgeon: Christie Cox, MD;  Location: [facility] OR;  Service: Orthopedics;  Laterality: Right;  Partial medial menisectomy   WISDOM TOOTH EXTRACTION      Current Medications: Current Meds  Medication  Sig   acetaminophen  (TYLENOL ) 650 MG CR tablet Take 650 mg by mouth every 8 (eight) hours as needed for pain.   amLODipine  (NORVASC ) 2.5 MG tablet Take 1 tablet by mouth once daily   chlorthalidone  (HYGROTON ) 25 MG tablet Take 1/2 (one-half) tablet by mouth once daily   metoprolol  succinate (TOPROL -XL) 50 MG 24 hr tablet Take 0.5 tablets (25 mg total) by mouth daily. Take with or immediately following a meal.   potassium chloride  SA (KLOR-CON  M) 20 MEQ tablet Take 1 tablet (20 mEq total) by mouth 2 (two) times daily.   Semaglutide , 2 MG/DOSE, (OZEMPIC , 2 MG/DOSE,) 8 MG/3ML SOPN INJECT 2 MG INTO THE SKIN EVERY 7 DAY     Allergies:   Patient has no known allergies.   Social History   Socioeconomic History   Marital status: Single    Spouse name: Not on file   Number of children: Not on file   Years of education: Not on file   Highest education level: Not on file  Occupational History   Not on file  Tobacco Use   Smoking status: Never    Passive exposure: Current (AUNT)   Smokeless tobacco: Never  Vaping Use   Vaping status: Never Used  Substance and Sexual Activity   Alcohol use: Yes    Comment: occ   Drug use: No   Sexual activity: Yes    Birth control/protection: Condom  Other Topics Concern   Not on file  Social History Narrative   Not on file   Social Drivers of Health   Financial Resource Strain: Low Risk  (07/13/2021)   Overall Financial Resource Strain (CARDIA)    Difficulty of Paying Living Expenses: Not hard at all  Food Insecurity: No Food Insecurity (07/13/2021)   Hunger Vital Sign    Worried About Running Out of Food in the Last Year: Never true    Ran Out of Food in the Last Year: Never true  Transportation Needs: No Transportation Needs (07/13/2021)   PRAPARE - Administrator, Civil Service (Medical): No    Lack of Transportation (Non-Medical): No  Physical Activity: Insufficiently Active (07/13/2021)   Exercise Vital Sign    Days of Exercise per  Week: 2 days    Minutes of Exercise per Session: 30 min  Stress: Not on file  Social Connections: Unknown (03/18/2022)   Received from [facility]   Social Network    Social Network: Not on file     Family History: The patient's family history includes Alzheimer's disease in her maternal grandmother; Breast cancer in her paternal aunt and paternal grandmother; Colon cancer in her paternal uncle; Colon polyps in her sister; Congestive Heart Failure in her father and mother; Crohn's disease in her sister; Diabetes in her father, maternal grandmother, mother, and sister; Epilepsy in her maternal grandmother; Esophageal cancer in her paternal uncle; Heart attack in her father and mother; Hyperlipidemia in her sister; Hypertension in her brother, father, maternal grandmother,  mother, and sister; Lung cancer in her paternal aunt; Lupus in her sister; Stroke in her mother.  ROS:   Please see the history of present illness.  All other systems are reviewed and negative.    EKGs/Labs/Other Studies Reviewed:    EKG: EKG is personally reviewed.  10/07/22: Sinus rhythm. Rate 66 bpm. Low-voltage. 9/22: Sinus rhythm  71 bpm.  1/22:Sinus rhythm.  Rate 73 bpm. 03/25/24:  EKG Interpretation Date/Time:    Ventricular Rate:    PR Interval:    QRS Duration:    QT Interval:    QTC Calculation:   R Axis:      Text Interpretation:         Recent Labs: 03/27/2023: BUN 12; Creatinine, Ser 1.03; Potassium 4.5; Sodium 142   07/05/19: Total cholesterol 151, triglycerides 62, HDL 51, LDL 87  Recent Lipid Panel    Component Value Date/Time   CHOL 159 10/18/2022 1110   TRIG 51 10/18/2022 1110   HDL 56 10/18/2022 1110   CHOLHDL 2.8 10/18/2022 1110   LDLCALC 92 10/18/2022 1110    Physical Exam:    VS:  BP 105/68 (BP Location: Right Arm, Patient Position: Sitting, Cuff Size: Large) Comment (Cuff Size): thigh cuff  Pulse 74   Ht 5\' 7"  (1.702 m)   Wt 291 lb (132 kg)   SpO2 98%    BMI 45.58 kg/m  , BMI Body mass index is 45.58 kg/m. GENERAL:  Well appearing HEENT: Pupils equal round and reactive, fundi not visualized, oral mucosa unremarkable NECK:  No jugular venous distention, waveform within normal limits, carotid upstroke brisk and symmetric, no bruits LUNGS:  Clear to auscultation bilaterally HEART:  RRR.  PMI not displaced or sustained,S1 and S2 within normal limits, no S3, no S4, no clicks, no rubs, no murmurs ABD:  Flat, positive bowel sounds normal in frequency in pitch, no bruits, no rebound, no guarding, no midline pulsatile mass, no hepatomegaly, no splenomegaly EXT:  2 plus pulses throughout, no edema, no cyanosis no clubbing SKIN:  hyperpigmentation on lower anterior tibia NEURO:  Cranial nerves II through XII grossly intact, motor grossly intact throughout PSYCH:  Cognitively intact, oriented to person place and time  ASSESSMENT:    1. Essential hypertension   2. OSA (obstructive sleep apnea)   3. Morbid obesity (HCC)      PLAN:    Assessment & Plan # Hypertension Blood pressure controlled with amlodipine , chlorthalidone , and metoprolol . Metoprolol  not taken for over two weeks, no palpitations, EKG normal. Metoprolol  used for PVC prevention and heart failure risk reduction. - Refill metoprolol  prescription. - Continue current antihypertensive regimen. - Recheck fasting lipid panel and comprehensive metabolic panel.  # Numbness in Hands Persistent numbness, etiology unclear, possible need for orthopedic evaluation. - Consult with primary care physician regarding potential referral to orthopedic specialist.  # Obstructive Sleep Apnea Non-compliance with CPAP due to discomfort.  She isn't a candidate for Inspire device.   # Obesity: # Diabetes: Weight plateaued with slight increase. Acknowledged dietary habits and lack of exercise as factors. Plans to prioritize weight loss and exercise. - Encourage dietary changes and meal preparation. -  Prioritize regular exercise and gym attendance. -Return for fasting lipids/CMP    Disposition:    FU with Shakedra Beam C. Theodis Fiscal, MD, [facility] in 1 year   Medication Adjustments/Labs and Tests Ordered: Current medicines are reviewed at length with the patient today.  Concerns regarding medicines are outlined above.  Orders Placed This Encounter  Procedures  EKG 12-Lead     No orders of the defined types were placed in this encounter.

## 2024-03-25 NOTE — Patient Instructions (Signed)
 Medication Instructions:  Your physician recommends that you continue on your current medications as directed. Please refer to the Current Medication list given to you today.    Follow-Up: 1 year in [facility] with Dr. Theodis Fiscal, Neomi Banks, NP or PharmD

## 2024-03-30 ENCOUNTER — Encounter (HOSPITAL_BASED_OUTPATIENT_CLINIC_OR_DEPARTMENT_OTHER): Payer: Self-pay | Admitting: Cardiovascular Disease

## 2024-04-13 ENCOUNTER — Other Ambulatory Visit (HOSPITAL_BASED_OUTPATIENT_CLINIC_OR_DEPARTMENT_OTHER): Payer: Self-pay | Admitting: *Deleted

## 2024-04-13 ENCOUNTER — Other Ambulatory Visit (HOSPITAL_COMMUNITY): Payer: Self-pay | Admitting: Nurse Practitioner

## 2024-04-13 DIAGNOSIS — Z13220 Encounter for screening for lipoid disorders: Secondary | ICD-10-CM | POA: Diagnosis not present

## 2024-04-13 DIAGNOSIS — I10 Essential (primary) hypertension: Secondary | ICD-10-CM

## 2024-04-13 DIAGNOSIS — Z5181 Encounter for therapeutic drug level monitoring: Secondary | ICD-10-CM | POA: Diagnosis not present

## 2024-04-14 ENCOUNTER — Encounter (HOSPITAL_BASED_OUTPATIENT_CLINIC_OR_DEPARTMENT_OTHER): Payer: Self-pay | Admitting: Cardiovascular Disease

## 2024-04-14 ENCOUNTER — Ambulatory Visit: Payer: Self-pay | Admitting: Cardiovascular Disease

## 2024-04-14 LAB — COMPREHENSIVE METABOLIC PANEL WITH GFR
ALT: 13 IU/L (ref 0–32)
AST: 18 IU/L (ref 0–40)
Albumin: 4 g/dL (ref 3.8–4.9)
Alkaline Phosphatase: 162 IU/L — ABNORMAL HIGH (ref 44–121)
BUN/Creatinine Ratio: 12 (ref 9–23)
BUN: 12 mg/dL (ref 6–24)
Bilirubin Total: 0.6 mg/dL (ref 0.0–1.2)
CO2: 21 mmol/L (ref 20–29)
Calcium: 9.4 mg/dL (ref 8.7–10.2)
Chloride: 104 mmol/L (ref 96–106)
Creatinine, Ser: 1.04 mg/dL — ABNORMAL HIGH (ref 0.57–1.00)
Globulin, Total: 2.8 g/dL (ref 1.5–4.5)
Glucose: 89 mg/dL (ref 70–99)
Potassium: 3.8 mmol/L (ref 3.5–5.2)
Sodium: 141 mmol/L (ref 134–144)
Total Protein: 6.8 g/dL (ref 6.0–8.5)
eGFR: 65 mL/min/{1.73_m2} (ref >59)

## 2024-04-14 LAB — LIPID PANEL
Chol/HDL Ratio: 2.9 ratio (ref 0.0–4.4)
Cholesterol, Total: 169 mg/dL (ref 100–199)
HDL: 59 mg/dL (ref >39)
LDL Chol Calc (NIH): 98 mg/dL (ref 0–99)
Triglycerides: 59 mg/dL (ref 0–149)
VLDL Cholesterol Cal: 12 mg/dL (ref 5–40)

## 2024-04-22 DIAGNOSIS — F439 Reaction to severe stress, unspecified: Secondary | ICD-10-CM | POA: Diagnosis not present

## 2024-05-04 ENCOUNTER — Other Ambulatory Visit: Payer: Self-pay | Admitting: Cardiovascular Disease

## 2024-05-04 DIAGNOSIS — E119 Type 2 diabetes mellitus without complications: Secondary | ICD-10-CM

## 2024-05-28 ENCOUNTER — Other Ambulatory Visit (HOSPITAL_BASED_OUTPATIENT_CLINIC_OR_DEPARTMENT_OTHER): Payer: Self-pay | Admitting: Cardiovascular Disease

## 2024-06-03 DIAGNOSIS — F439 Reaction to severe stress, unspecified: Secondary | ICD-10-CM | POA: Diagnosis not present

## 2024-06-21 DIAGNOSIS — F439 Reaction to severe stress, unspecified: Secondary | ICD-10-CM | POA: Diagnosis not present

## 2024-07-19 DIAGNOSIS — Z1231 Encounter for screening mammogram for malignant neoplasm of breast: Secondary | ICD-10-CM | POA: Diagnosis not present

## 2024-07-19 DIAGNOSIS — I10 Essential (primary) hypertension: Secondary | ICD-10-CM | POA: Diagnosis not present

## 2024-07-19 DIAGNOSIS — Z0000 Encounter for general adult medical examination without abnormal findings: Secondary | ICD-10-CM | POA: Diagnosis not present

## 2024-07-19 DIAGNOSIS — G4733 Obstructive sleep apnea (adult) (pediatric): Secondary | ICD-10-CM | POA: Diagnosis not present

## 2024-07-19 DIAGNOSIS — E669 Obesity, unspecified: Secondary | ICD-10-CM | POA: Diagnosis not present

## 2024-07-19 DIAGNOSIS — E1165 Type 2 diabetes mellitus with hyperglycemia: Secondary | ICD-10-CM | POA: Diagnosis not present

## 2024-07-19 DIAGNOSIS — F439 Reaction to severe stress, unspecified: Secondary | ICD-10-CM | POA: Diagnosis not present

## 2024-07-20 DIAGNOSIS — E1165 Type 2 diabetes mellitus with hyperglycemia: Secondary | ICD-10-CM | POA: Diagnosis not present

## 2024-07-20 DIAGNOSIS — Z1329 Encounter for screening for other suspected endocrine disorder: Secondary | ICD-10-CM | POA: Diagnosis not present

## 2024-07-20 DIAGNOSIS — Z114 Encounter for screening for human immunodeficiency virus [HIV]: Secondary | ICD-10-CM | POA: Diagnosis not present

## 2024-07-20 DIAGNOSIS — R5383 Other fatigue: Secondary | ICD-10-CM | POA: Diagnosis not present

## 2024-07-20 DIAGNOSIS — I10 Essential (primary) hypertension: Secondary | ICD-10-CM | POA: Diagnosis not present

## 2024-07-29 DIAGNOSIS — F439 Reaction to severe stress, unspecified: Secondary | ICD-10-CM | POA: Diagnosis not present

## 2024-08-16 DIAGNOSIS — G4730 Sleep apnea, unspecified: Secondary | ICD-10-CM | POA: Diagnosis not present

## 2024-08-16 DIAGNOSIS — I10 Essential (primary) hypertension: Secondary | ICD-10-CM | POA: Diagnosis not present

## 2024-08-16 DIAGNOSIS — E669 Obesity, unspecified: Secondary | ICD-10-CM | POA: Diagnosis not present

## 2024-08-16 DIAGNOSIS — E1165 Type 2 diabetes mellitus with hyperglycemia: Secondary | ICD-10-CM | POA: Diagnosis not present

## 2024-08-18 ENCOUNTER — Telehealth: Payer: Self-pay | Admitting: Pharmacy Technician

## 2024-08-18 DIAGNOSIS — J040 Acute laryngitis: Secondary | ICD-10-CM | POA: Diagnosis not present

## 2024-08-18 DIAGNOSIS — F439 Reaction to severe stress, unspecified: Secondary | ICD-10-CM | POA: Diagnosis not present

## 2024-08-18 NOTE — Telephone Encounter (Signed)
   Pharmacy Patient Advocate Encounter   Received notification from CoverMyMeds that prior authorization for ozempic  is required/requested.   Insurance verification completed.   The patient is insured through [facility].   Per test claim: PA required; PA submitted to above mentioned insurance via Latent Key/confirmation #/EOC [id number] Status is pending   Pharmacy Patient Advocate Encounter  Received notification from [facility] that Prior Authorization for ozempic  has been APPROVED from 08/18/24 to 08/18/25   PA #/Case ID/Reference #: [id number]

## 2024-08-23 ENCOUNTER — Other Ambulatory Visit (HOSPITAL_BASED_OUTPATIENT_CLINIC_OR_DEPARTMENT_OTHER): Payer: Self-pay | Admitting: Cardiovascular Disease

## 2024-09-14 DIAGNOSIS — F439 Reaction to severe stress, unspecified: Secondary | ICD-10-CM | POA: Diagnosis not present

## 2024-09-23 DIAGNOSIS — R202 Paresthesia of skin: Secondary | ICD-10-CM | POA: Diagnosis not present

## 2024-09-23 DIAGNOSIS — M65331 Trigger finger, right middle finger: Secondary | ICD-10-CM | POA: Diagnosis not present

## 2024-09-23 DIAGNOSIS — R200 Anesthesia of skin: Secondary | ICD-10-CM | POA: Diagnosis not present

## 2024-10-14 DIAGNOSIS — F439 Reaction to severe stress, unspecified: Secondary | ICD-10-CM | POA: Diagnosis not present

## 2024-10-22 ENCOUNTER — Other Ambulatory Visit: Payer: Self-pay | Admitting: Cardiovascular Disease

## 2024-10-26 DIAGNOSIS — F439 Reaction to severe stress, unspecified: Secondary | ICD-10-CM | POA: Diagnosis not present
